# Patient Record
Sex: Female | Born: 1970 | Race: Black or African American | Hispanic: No | Smoking: Never smoker
Health system: Southern US, Community
[De-identification: ages and names within clinical notes are randomized; demographics above are authoritative.]

## PROBLEM LIST (undated history)

## (undated) DIAGNOSIS — I2699 Other pulmonary embolism without acute cor pulmonale: Secondary | ICD-10-CM

## (undated) DIAGNOSIS — G43909 Migraine, unspecified, not intractable, without status migrainosus: Secondary | ICD-10-CM

## (undated) DIAGNOSIS — R002 Palpitations: Secondary | ICD-10-CM

## (undated) DIAGNOSIS — D649 Anemia, unspecified: Secondary | ICD-10-CM

## (undated) DIAGNOSIS — R569 Unspecified convulsions: Secondary | ICD-10-CM

## (undated) DIAGNOSIS — J40 Bronchitis, not specified as acute or chronic: Secondary | ICD-10-CM

## (undated) DIAGNOSIS — F329 Major depressive disorder, single episode, unspecified: Secondary | ICD-10-CM

## (undated) DIAGNOSIS — J45909 Unspecified asthma, uncomplicated: Secondary | ICD-10-CM

## (undated) DIAGNOSIS — F32A Depression, unspecified: Secondary | ICD-10-CM

## (undated) DIAGNOSIS — N809 Endometriosis, unspecified: Secondary | ICD-10-CM

## (undated) HISTORY — PX: DILATION AND CURETTAGE OF UTERUS: SHX78

## (undated) HISTORY — DX: Unspecified convulsions: R56.9

## (undated) HISTORY — DX: Endometriosis, unspecified: N80.9

## (undated) HISTORY — DX: Bronchitis, not specified as acute or chronic: J40

## (undated) HISTORY — DX: Migraine, unspecified, not intractable, without status migrainosus: G43.909

## (undated) HISTORY — DX: Depression, unspecified: F32.A

## (undated) HISTORY — PX: APPENDECTOMY: SHX54

## (undated) HISTORY — DX: Major depressive disorder, single episode, unspecified: F32.9

---

## 2003-12-17 HISTORY — PX: ABDOMINAL HYSTERECTOMY: SHX81

## 2008-12-06 ENCOUNTER — Ambulatory Visit: Payer: Self-pay | Admitting: Family Medicine

## 2009-07-09 ENCOUNTER — Ambulatory Visit: Payer: Self-pay | Admitting: Internal Medicine

## 2010-02-11 ENCOUNTER — Ambulatory Visit: Payer: Self-pay | Admitting: Family Medicine

## 2011-03-31 ENCOUNTER — Ambulatory Visit: Payer: Self-pay | Admitting: Family Medicine

## 2013-02-04 ENCOUNTER — Ambulatory Visit: Payer: Self-pay | Admitting: Emergency Medicine

## 2013-02-09 ENCOUNTER — Ambulatory Visit: Payer: Self-pay | Admitting: Family Medicine

## 2013-02-12 ENCOUNTER — Emergency Department: Payer: Self-pay | Admitting: Emergency Medicine

## 2013-02-12 LAB — ETHANOL: Ethanol %: <0.003 % (ref 0.000–0.080)

## 2013-02-12 LAB — COMPREHENSIVE METABOLIC PANEL
Albumin: 3.1 g/dL — ABNORMAL LOW (ref 3.4–5.0)
Alkaline Phosphatase: 44 U/L — ABNORMAL LOW (ref 50–136)
Anion Gap: 7 (ref 7–16)
BUN: 21 mg/dL — ABNORMAL HIGH (ref 7–18)
Bilirubin,Total: 0.5 mg/dL (ref 0.2–1.0)
Calcium, Total: 7.8 mg/dL — ABNORMAL LOW (ref 8.5–10.1)
Chloride: 104 mmol/L (ref 98–107)
Co2: 27 mmol/L (ref 21–32)
Creatinine: 0.96 mg/dL (ref 0.60–1.30)
EGFR (African American): >60
EGFR (Non-African Amer.): >60
Glucose: 141 mg/dL — ABNORMAL HIGH (ref 65–99)
Total Protein: 6.5 g/dL (ref 6.4–8.2)

## 2013-02-12 LAB — CBC
HCT: 32.1 % — ABNORMAL LOW (ref 35.0–47.0)
HGB: 10.7 g/dL — ABNORMAL LOW (ref 12.0–16.0)
MCH: 30 pg (ref 26.0–34.0)
MCHC: 33.5 g/dL (ref 32.0–36.0)
MCV: 90 fL (ref 80–100)
Platelet: 256 10*3/uL (ref 150–440)
RBC: 3.58 10*6/uL — ABNORMAL LOW (ref 3.80–5.20)
RDW: 12.7 % (ref 11.5–14.5)

## 2013-03-05 ENCOUNTER — Ambulatory Visit: Payer: Self-pay | Admitting: Internal Medicine

## 2013-06-21 ENCOUNTER — Ambulatory Visit (INDEPENDENT_AMBULATORY_CARE_PROVIDER_SITE_OTHER): Payer: BC Managed Care – PPO | Admitting: Internal Medicine

## 2013-06-21 ENCOUNTER — Encounter: Payer: Self-pay | Admitting: Internal Medicine

## 2013-06-21 VITALS — BP 110/70 | HR 75 | Temp 98.4°F | Ht 67.0 in | Wt 188.0 lb

## 2013-06-21 DIAGNOSIS — M8580 Other specified disorders of bone density and structure, unspecified site: Secondary | ICD-10-CM | POA: Insufficient documentation

## 2013-06-21 DIAGNOSIS — Z1239 Encounter for other screening for malignant neoplasm of breast: Secondary | ICD-10-CM | POA: Insufficient documentation

## 2013-06-21 DIAGNOSIS — M899 Disorder of bone, unspecified: Secondary | ICD-10-CM

## 2013-06-21 DIAGNOSIS — Z0000 Encounter for general adult medical examination without abnormal findings: Secondary | ICD-10-CM

## 2013-06-21 LAB — COMPREHENSIVE METABOLIC PANEL
Albumin: 4 g/dL (ref 3.5–5.2)
CO2: 29 mEq/L (ref 19–32)
Calcium: 9.2 mg/dL (ref 8.4–10.5)
Chloride: 105 mEq/L (ref 96–112)
GFR: 86.62 mL/min (ref >60.00)
Glucose, Bld: 93 mg/dL (ref 70–99)
Potassium: 3.9 mEq/L (ref 3.5–5.1)
Sodium: 139 mEq/L (ref 135–145)
Total Protein: 7.4 g/dL (ref 6.0–8.3)

## 2013-06-21 LAB — CBC WITH DIFFERENTIAL/PLATELET
Eosinophils Relative: 2.8 % (ref 0.0–5.0)
MCV: 90.1 fl (ref 78.0–100.0)
Monocytes Absolute: 0.3 10*3/uL (ref 0.1–1.0)
Monocytes Relative: 7.5 % (ref 3.0–12.0)
Neutrophils Relative %: 60.8 % (ref 43.0–77.0)
Platelets: 225 10*3/uL (ref 150.0–400.0)
WBC: 3.5 10*3/uL — ABNORMAL LOW (ref 4.5–10.5)

## 2013-06-21 LAB — LIPID PANEL
Cholesterol: 228 mg/dL — ABNORMAL HIGH (ref 0–200)
Total CHOL/HDL Ratio: 3
Triglycerides: 53 mg/dL (ref 0.0–149.0)

## 2013-06-21 NOTE — Assessment & Plan Note (Signed)
Mammogram ordered

## 2013-06-21 NOTE — Progress Notes (Signed)
  Subjective:    Patient ID: Patricia Collins, female    DOB: 1971-03-11, 42 y.o.   MRN: [id number]  HPI 42YO female with h/o endometriosis s/p hysterectomy and osteopenia presents to establish care. Doing well. Only concern today is need for follow up on osteopenia. Notes h/o osteopenia and Vitamin D deficiency in the past. Otherwise, feeling well.  Distant h/o seizure disorder, however none in last >20 years. Previously treated with phenobarbital.  No outpatient encounter prescriptions on file as of 06/21/2013.   No facility-administered encounter medications on file as of 06/21/2013.   BP 110/70  Pulse 75  Temp(Src) 98.4 F (36.9 C) (Oral)  Ht 5\' 7"  (1.702 m)  Wt 188 lb (85.276 kg)  BMI 29.44 kg/m2  SpO2 99%  Review of Systems  Constitutional: Negative for fever, chills, appetite change, fatigue and unexpected weight change.  HENT: Negative for ear pain, congestion, sore throat, trouble swallowing, neck pain, voice change and sinus pressure.   Eyes: Negative for visual disturbance.  Respiratory: Negative for cough, shortness of breath, wheezing and stridor.   Cardiovascular: Negative for chest pain, palpitations and leg swelling.  Gastrointestinal: Negative for nausea, vomiting, abdominal pain, diarrhea, constipation, blood in stool, abdominal distention and anal bleeding.  Genitourinary: Negative for dysuria and flank pain.  Musculoskeletal: Negative for myalgias, arthralgias and gait problem.  Skin: Negative for color change and rash.  Neurological: Negative for dizziness and headaches.  Hematological: Negative for adenopathy. Does not bruise/bleed easily.  Psychiatric/Behavioral: Negative for suicidal ideas, sleep disturbance and dysphoric mood. The patient is not nervous/anxious.        Objective:   Physical Exam  Constitutional: She is oriented to person, place, and time. She appears well-developed and well-nourished. No distress.  HENT:  Head: Normocephalic and atraumatic.   Right Ear: External ear normal.  Left Ear: External ear normal.  Nose: Nose normal.  Mouth/Throat: Oropharynx is clear and moist. No oropharyngeal exudate.  Eyes: Conjunctivae are normal. Pupils are equal, round, and reactive to light. Right eye exhibits no discharge. Left eye exhibits no discharge. No scleral icterus.  Neck: Normal range of motion. Neck supple. No tracheal deviation present. No thyromegaly present.  Cardiovascular: Normal rate, regular rhythm, normal heart sounds and intact distal pulses.  Exam reveals no gallop and no friction rub.   No murmur heard. Pulmonary/Chest: Effort normal and breath sounds normal. No accessory muscle usage. Not tachypneic. No respiratory distress. She has no decreased breath sounds. She has no wheezes. She has no rhonchi. She has no rales. She exhibits no tenderness.  Abdominal: Soft. Bowel sounds are normal. She exhibits no distension and no mass. There is no tenderness. There is no rebound and no guarding.  Musculoskeletal: Normal range of motion. She exhibits no edema and no tenderness.  Lymphadenopathy:    She has no cervical adenopathy.  Neurological: She is alert and oriented to person, place, and time. No cranial nerve deficit. She exhibits normal muscle tone. Coordination normal.  Skin: Skin is warm and dry. No rash noted. She is not diaphoretic. No erythema. No pallor.  Psychiatric: She has a normal mood and affect. Her behavior is normal. Judgment and thought content normal.          Assessment & Plan:

## 2013-06-21 NOTE — Assessment & Plan Note (Signed)
Pt reports h/o osteopenia. Will recheck bone density testing and check Vit D with labs. Will request previous records.

## 2013-06-22 ENCOUNTER — Ambulatory Visit: Payer: BC Managed Care – PPO

## 2013-06-22 DIAGNOSIS — Z0000 Encounter for general adult medical examination without abnormal findings: Secondary | ICD-10-CM

## 2013-06-22 DIAGNOSIS — M899 Disorder of bone, unspecified: Secondary | ICD-10-CM

## 2013-06-22 LAB — VITAMIN B12: Vitamin B-12: 391 pg/mL (ref 211–911)

## 2013-06-23 ENCOUNTER — Encounter: Payer: Self-pay | Admitting: *Deleted

## 2013-06-25 ENCOUNTER — Telehealth: Payer: Self-pay | Admitting: Internal Medicine

## 2013-06-25 DIAGNOSIS — R41840 Attention and concentration deficit: Secondary | ICD-10-CM

## 2013-06-25 NOTE — Telephone Encounter (Signed)
Patient aware of this.

## 2013-06-25 NOTE — Telephone Encounter (Signed)
We can place referral to psychology for ADD evaluation. I will place order.

## 2013-06-25 NOTE — Telephone Encounter (Signed)
Patient called stating that she is having issues concentrating and memory loss. She states that Dr. Dan Humphreys and herself talked about some testing. Please advise. She does not have a preference with MD's.

## 2013-07-04 ENCOUNTER — Emergency Department: Payer: Self-pay | Admitting: Emergency Medicine

## 2013-07-04 LAB — COMPREHENSIVE METABOLIC PANEL
BUN: 14 mg/dL (ref 7–18)
Co2: 30 mmol/L (ref 21–32)
EGFR (Non-African Amer.): >60
Potassium: 4.1 mmol/L (ref 3.5–5.1)
SGOT(AST): 28 U/L (ref 15–37)
Sodium: 140 mmol/L (ref 136–145)
Total Protein: 7.6 g/dL (ref 6.4–8.2)

## 2013-07-04 LAB — CBC WITH DIFFERENTIAL/PLATELET
Eosinophil #: 0.1 10*3/uL (ref 0.0–0.7)
HCT: 31.8 % — ABNORMAL LOW (ref 35.0–47.0)
HGB: 10.5 g/dL — ABNORMAL LOW (ref 12.0–16.0)
Lymphocyte #: 0.7 10*3/uL — ABNORMAL LOW (ref 1.0–3.6)
Lymphocyte %: 12.3 %
Neutrophil %: 79.9 %

## 2013-07-07 ENCOUNTER — Ambulatory Visit (INDEPENDENT_AMBULATORY_CARE_PROVIDER_SITE_OTHER): Payer: BC Managed Care – PPO | Admitting: Adult Health

## 2013-07-07 ENCOUNTER — Encounter: Payer: Self-pay | Admitting: Adult Health

## 2013-07-07 VITALS — BP 108/62 | HR 68 | Temp 98.6°F | Resp 12 | Wt 189.0 lb

## 2013-07-07 DIAGNOSIS — Z87898 Personal history of other specified conditions: Secondary | ICD-10-CM | POA: Insufficient documentation

## 2013-07-07 DIAGNOSIS — R1110 Vomiting, unspecified: Secondary | ICD-10-CM

## 2013-07-07 LAB — BASIC METABOLIC PANEL
BUN: 11 mg/dL (ref 6–23)
GFR: 92.38 mL/min (ref >60.00)
Potassium: 4.2 mEq/L (ref 3.5–5.1)
Sodium: 139 mEq/L (ref 135–145)

## 2013-07-07 LAB — CBC WITH DIFFERENTIAL/PLATELET
Eosinophils Relative: 4.3 % (ref 0.0–5.0)
HCT: 32 % — ABNORMAL LOW (ref 36.0–46.0)
Hemoglobin: 10.6 g/dL — ABNORMAL LOW (ref 12.0–15.0)
Lymphocytes Relative: 32 % (ref 12.0–46.0)
Lymphs Abs: 1 10*3/uL (ref 0.7–4.0)
Monocytes Relative: 8.1 % (ref 3.0–12.0)
Platelets: 203 10*3/uL (ref 150.0–400.0)
WBC: 3.1 10*3/uL — ABNORMAL LOW (ref 4.5–10.5)

## 2013-07-07 NOTE — Progress Notes (Signed)
  Subjective:    Patient ID: Patricia Collins, female    DOB: 09/19/1971, 42 y.o.   MRN: [id number]  HPI  Patient reports for hospital f/u. She went to the ED after vomiting extensively on Saturday. She reports that she began to feel faint. In the ED she was given fluid ruscusitation and medication for vomiting. She believes the vomiting was attributed to something she ate. She was in the ED for approximately 12 hours. Pt is feeling weak. Unfortunately I do not have any of the hospital records for this ED visit and the patient is not able to answer details such as if they told her that her white count was elevated or that she had infection. She does not know if her electrolytes were off and if she received any replacement of such. I will obtain medical records.   Review of Systems  Constitutional: Positive for appetite change and fatigue. Negative for fever and chills.  Respiratory: Negative.   Cardiovascular: Negative.   Gastrointestinal: Negative for nausea, vomiting, abdominal pain and diarrhea.  Genitourinary: Negative.   Neurological: Positive for weakness. Negative for dizziness, light-headedness and headaches.  Psychiatric/Behavioral: Negative.   All other systems reviewed and are negative.    BP 108/62  Pulse 68  Temp(Src) 98.6 F (37 C) (Oral)  Resp 12  Wt 189 lb (85.73 kg)  BMI 29.59 kg/m2  SpO2 98%    Objective:   Physical Exam  Constitutional: She is oriented to person, place, and time. She appears well-developed and well-nourished. No distress.  Cardiovascular: Normal rate, regular rhythm and normal heart sounds.  Exam reveals no gallop and no friction rub.   No murmur heard. Pulmonary/Chest: Effort normal. No respiratory distress. She has no wheezes. She has rales.  Abdominal: Soft.  Hyperactive bowel sounds.  Neurological: She is alert and oriented to person, place, and time. She has normal reflexes.  Skin: Skin is warm and dry.  Psychiatric: She has a normal mood  and affect. Her behavior is normal. Judgment and thought content normal.      Assessment & Plan:

## 2013-07-07 NOTE — Assessment & Plan Note (Signed)
Patient was recently seen in the ED on Sunday for vomiting 2/2 possible food poisoning. She was given fluid resuscitation and antiemetic. She is no longer vomiting but still feeling weak. Check metabolic panel and cbc. Patient to continue diet as tolerated.

## 2013-07-07 NOTE — Patient Instructions (Signed)
  Please have labs drawn prior to leaving the office.  We will notify you of results once they are available.  Diet as tolerated.  Drink fluids to maintain hydration.  Call if symptoms return.

## 2013-07-08 ENCOUNTER — Emergency Department: Payer: Self-pay | Admitting: Emergency Medicine

## 2013-07-19 ENCOUNTER — Ambulatory Visit: Payer: BC Managed Care – PPO | Admitting: Internal Medicine

## 2013-07-19 ENCOUNTER — Ambulatory Visit (INDEPENDENT_AMBULATORY_CARE_PROVIDER_SITE_OTHER): Payer: BC Managed Care – PPO | Admitting: Internal Medicine

## 2013-07-19 ENCOUNTER — Encounter: Payer: Self-pay | Admitting: Internal Medicine

## 2013-07-19 VITALS — BP 120/86 | HR 78 | Temp 98.6°F | Wt 187.0 lb

## 2013-07-19 DIAGNOSIS — S61219A Laceration without foreign body of unspecified finger without damage to nail, initial encounter: Secondary | ICD-10-CM | POA: Insufficient documentation

## 2013-07-19 DIAGNOSIS — S61219D Laceration without foreign body of unspecified finger without damage to nail, subsequent encounter: Secondary | ICD-10-CM

## 2013-07-19 DIAGNOSIS — Z5189 Encounter for other specified aftercare: Secondary | ICD-10-CM

## 2013-07-19 DIAGNOSIS — D649 Anemia, unspecified: Secondary | ICD-10-CM

## 2013-07-19 NOTE — Progress Notes (Signed)
Subjective:    Patient ID: Patricia Collins, female    DOB: 1971/02/21, 42 y.o.   MRN: [id number]  HPI 42 year old female presents for followup. She reports that approximately one week ago she was using a knife in her kitchen when she accidentally sliced her right index finger. She was seen in the ER and had 3 stitches placed. She reports that the area has been tender to palpation. She denies any drainage from the wound. She denies any fever or chills. She reports that she is up-to-date on her tetanus shot.  In regards to recent lab work which showed mild anemia, she reports that throughout her entire life she has been told she is anemic. This was never evaluated further. She denies any fatigue, shortness of breath, chest pain, palpitations. She is status post hysterectomy for endometriosis several years ago. She denies any change in bowel habits, blood in her stool or black stool.  No outpatient encounter prescriptions on file as of 07/19/2013.   No facility-administered encounter medications on file as of 07/19/2013.   BP 120/86  Pulse 78  Temp(Src) 98.6 F (37 C) (Oral)  Wt 187 lb (84.823 kg)  BMI 29.28 kg/m2  SpO2 96%  Review of Systems  Constitutional: Negative for fever, chills, appetite change, fatigue and unexpected weight change.  HENT: Negative for ear pain, congestion, sore throat, trouble swallowing, neck pain, voice change and sinus pressure.   Eyes: Negative for visual disturbance.  Respiratory: Negative for cough, shortness of breath, wheezing and stridor.   Cardiovascular: Negative for chest pain, palpitations and leg swelling.  Gastrointestinal: Negative for nausea, vomiting, abdominal pain, diarrhea, constipation, blood in stool, abdominal distention and anal bleeding.  Genitourinary: Negative for dysuria and flank pain.  Musculoskeletal: Negative for myalgias, arthralgias and gait problem.  Skin: Negative for color change and rash.  Neurological: Negative for dizziness and  headaches.  Hematological: Negative for adenopathy. Does not bruise/bleed easily.  Psychiatric/Behavioral: Negative for suicidal ideas, sleep disturbance and dysphoric mood. The patient is not nervous/anxious.        Objective:   Physical Exam  Constitutional: She is oriented to person, place, and time. She appears well-developed and well-nourished. No distress.  HENT:  Head: Normocephalic and atraumatic.  Right Ear: External ear normal.  Left Ear: External ear normal.  Nose: Nose normal.  Mouth/Throat: Oropharynx is clear and moist. No oropharyngeal exudate.  Eyes: Conjunctivae are normal. Pupils are equal, round, and reactive to light. Right eye exhibits no discharge. Left eye exhibits no discharge. No scleral icterus.  Neck: Normal range of motion. Neck supple. No tracheal deviation present. No thyromegaly present.  Cardiovascular: Normal rate, regular rhythm, normal heart sounds and intact distal pulses.  Exam reveals no gallop and no friction rub.   No murmur heard. Pulmonary/Chest: Effort normal and breath sounds normal. No accessory muscle usage. Not tachypneic. No respiratory distress. She has no decreased breath sounds. She has no wheezes. She has no rhonchi. She has no rales. She exhibits no tenderness.  Musculoskeletal: Normal range of motion. She exhibits no edema and no tenderness.  Lymphadenopathy:    She has no cervical adenopathy.  Neurological: She is alert and oriented to person, place, and time. No cranial nerve deficit. She exhibits normal muscle tone. Coordination normal.  Skin: Skin is warm and dry. Laceration (right index finger, with 3 sutures, no surrounding erythema or induration) noted. No rash noted. She is not diaphoretic. No erythema. No pallor.  Psychiatric: She has a normal mood and affect.  Her behavior is normal. Judgment and thought content normal.          Assessment & Plan:

## 2013-07-19 NOTE — Assessment & Plan Note (Signed)
Laceration healing well. Sutures removed. Will request status patient's recent tetanus shot.

## 2013-07-19 NOTE — Assessment & Plan Note (Signed)
Blood counts show mild anemia. Patient reports long history of mild anemia he denies a child. Ferritin was slightly low at 21. No evidence of blood loss, however will check stool hemoccult. Will also set up hematology evaluation given her h/o chronic anemia, question if she may have underlying thalassemia or other inherited reason for mild anemia.

## 2013-10-04 ENCOUNTER — Ambulatory Visit (INDEPENDENT_AMBULATORY_CARE_PROVIDER_SITE_OTHER)
Admission: RE | Admit: 2013-10-04 | Discharge: 2013-10-04 | Disposition: A | Discharge status: Home / Self Care | Payer: BC Managed Care – PPO | Source: Ambulatory Visit | Attending: Internal Medicine | Admitting: Internal Medicine

## 2013-10-04 ENCOUNTER — Encounter: Payer: Self-pay | Admitting: Internal Medicine

## 2013-10-04 ENCOUNTER — Encounter (INDEPENDENT_AMBULATORY_CARE_PROVIDER_SITE_OTHER): Payer: Self-pay

## 2013-10-04 ENCOUNTER — Ambulatory Visit (INDEPENDENT_AMBULATORY_CARE_PROVIDER_SITE_OTHER): Payer: BC Managed Care – PPO | Admitting: Internal Medicine

## 2013-10-04 VITALS — BP 102/80 | HR 72 | Temp 98.5°F | Wt 189.0 lb

## 2013-10-04 DIAGNOSIS — R05 Cough: Secondary | ICD-10-CM

## 2013-10-04 DIAGNOSIS — R059 Cough, unspecified: Secondary | ICD-10-CM

## 2013-10-04 DIAGNOSIS — D649 Anemia, unspecified: Secondary | ICD-10-CM

## 2013-10-04 DIAGNOSIS — J45909 Unspecified asthma, uncomplicated: Secondary | ICD-10-CM | POA: Insufficient documentation

## 2013-10-04 DIAGNOSIS — R053 Chronic cough: Secondary | ICD-10-CM

## 2013-10-04 MED ORDER — BENZONATATE 200 MG PO CAPS: 200.0000 mg | ORAL_CAPSULE | Freq: Two times a day (BID) | ORAL | Status: DC | PRN

## 2013-10-04 NOTE — Progress Notes (Signed)
  Subjective:    Patient ID: Patricia Collins, female    DOB: [birth year], 42 y.o.   MRN: [id number]  HPI 42 year old female presents for acute visit complaining of one-week history of cough. She reports that ever since moving into a new apartment in September 2014 she has had intermittent episodes of cough. Last week, she developed cough productive of blood-tinged sputum. She denies any other upper respiratory symptoms such as nasal congestion, sneezing, shortness of breath, or fever. She does occasionally have some substernal chest pain with coughing. She is not a smoker. She is not around cigarette smoke. She has no history of asthma. She is not currently taking any medication for cough.  Outpatient Encounter Prescriptions as of 10/04/2013  Medication Sig Dispense Refill   No facility-administered encounter medications on file as of 10/04/2013.   BP 102/80  Pulse 72  Temp(Src) 98.5 F (36.9 C) (Oral)  Wt 189 lb (85.73 kg)  BMI 29.59 kg/m2  Review of Systems  Constitutional: Negative for fever, chills, appetite change, fatigue and unexpected weight change.  HENT: Negative for congestion, ear pain, postnasal drip, sinus pressure, sore throat, trouble swallowing and voice change.   Eyes: Negative for visual disturbance.  Respiratory: Positive for cough. Negative for shortness of breath, wheezing and stridor.   Cardiovascular: Negative for palpitations and leg swelling.  Gastrointestinal: Negative for abdominal pain.  Genitourinary: Negative for dysuria and flank pain.  Musculoskeletal: Negative for arthralgias, gait problem, myalgias and neck pain.  Skin: Negative for color change and rash.  Neurological: Negative for dizziness and headaches.  Hematological: Negative for adenopathy. Does not bruise/bleed easily.       Objective:   Physical Exam  Constitutional: She is oriented to person, place, and time. She appears well-developed and well-nourished. No distress.  HENT:  Head:  Normocephalic and atraumatic.  Right Ear: External ear normal.  Left Ear: External ear normal.  Nose: Nose normal.  Mouth/Throat: Oropharynx is clear and moist. No oropharyngeal exudate.  Eyes: Conjunctivae are normal. Pupils are equal, round, and reactive to light. Right eye exhibits no discharge. Left eye exhibits no discharge. No scleral icterus.  Neck: Normal range of motion. Neck supple. No tracheal deviation present. No thyromegaly present.  Cardiovascular: Normal rate, regular rhythm, normal heart sounds and intact distal pulses.  Exam reveals no gallop and no friction rub.   No murmur heard. Pulmonary/Chest: Effort normal and breath sounds normal. No accessory muscle usage. Not tachypneic. No respiratory distress. She has no decreased breath sounds. She has no wheezes. She has no rhonchi. She has no rales. She exhibits no tenderness.  Musculoskeletal: Normal range of motion. She exhibits no edema and no tenderness.  Lymphadenopathy:    She has no cervical adenopathy.  Neurological: She is alert and oriented to person, place, and time. No cranial nerve deficit. She exhibits normal muscle tone. Coordination normal.  Skin: Skin is warm and dry. No rash noted. She is not diaphoretic. No erythema. No pallor.  Psychiatric: She has a normal mood and affect. Her behavior is normal. Judgment and thought content normal.          Assessment & Plan:

## 2013-10-04 NOTE — Assessment & Plan Note (Signed)
Hematology evaluation pending at [facility] for 10/2013.

## 2013-10-04 NOTE — Assessment & Plan Note (Signed)
2 month history of intermittent episodes of cough, now productive cough with occasional blood-tinged sputum. Exam is normal today, with pt breathing comfortably and no abnormalities noted on exam. (Note nurse unable to get pulse oximetry because of pt dark nail polish.) CXR today normal. Given persistent symptoms, question if she may have underlying asthma which was triggered by allergen in home environment. Will set up pulmonary evaluation for PFTs. Pt will call if any worsening symptoms over next few days. Will start Tessalon to help suppress cough.

## 2013-10-12 ENCOUNTER — Ambulatory Visit (INDEPENDENT_AMBULATORY_CARE_PROVIDER_SITE_OTHER): Payer: BC Managed Care – PPO | Admitting: Pulmonary Disease

## 2013-10-12 ENCOUNTER — Encounter: Payer: Self-pay | Admitting: Pulmonary Disease

## 2013-10-12 VITALS — BP 102/60 | HR 67 | Temp 97.9°F | Ht 67.0 in | Wt 190.8 lb

## 2013-10-12 DIAGNOSIS — R05 Cough: Secondary | ICD-10-CM | POA: Diagnosis not present

## 2013-10-12 DIAGNOSIS — K219 Gastro-esophageal reflux disease without esophagitis: Secondary | ICD-10-CM | POA: Diagnosis not present

## 2013-10-12 DIAGNOSIS — R053 Chronic cough: Secondary | ICD-10-CM

## 2013-10-12 DIAGNOSIS — R059 Cough, unspecified: Secondary | ICD-10-CM

## 2013-10-12 NOTE — Patient Instructions (Addendum)
Take prilosec 20mg  daily Follow the GERD lifestyle sheet  Use saline for post nasal drip (I like Neil Med rinse) and sinus congestion Use chlorpheniramine and phenylephrine combination pills for sinus congestoin  Use the steroid inhaler one puff twice a day for the next month  Rest your voice and suppress your cough as much as possible for 2-3 days, use Delsym to help with this.  Follow up with [place] in one month

## 2013-10-12 NOTE — Progress Notes (Signed)
Subjective:    Patient ID: Patricia Collins, female    DOB: [birth year], 42 y.o.   MRN: [id number]  HPI  Patricia Collins is a 42 year old female who comes to our clinic today for evaluation of cough. She had a normal childhood without respiratory illnesses but starting in September of 2013 she has had an intermittent cough which has been persistent. She initially was diagnosed with bronchitis over one year ago and was treated with multiple antibiotics. She was also treated with steroids and an inhaler. The cough had a barking quality and would occur sometimes in the office. There is no clear trigger or location which would set off. However she does note that the cough has been markedly worse when lying flat. She typically produces some phlegm but it has been dry lately. She had worsening of the symptoms about a month ago again with a harsh, "barking" cough. She does note that after one particularly bad coughing episode about 2 weeks ago she had a small amount of blood in her mucus. This is not recurred since then. She has not had shortness of breath with this. She occasionally has headaches at the lung with that. She has never had any sort of breathing test in the past. She has never smoked cigarettes.  She has noted that she has had bad acid reflux for the last several years. She's currently not taking anything for this. She does not follow a GERD specific diet.  She notes that she has had some allergic rhinitis issues over the years but this has not been particularly worse in the last several weeks.   Past Medical History  Diagnosis Date  . Depression   . Endometriosis   . Migraine     improved with diet  . Bronchitis   . Seizures     42YO, phenobarbital in the past     Family History  Problem Relation Age of Onset  . Stroke Mother   . Heart disease Mother     h/o rheumatic fever  . Diabetes Mother   . Hypertension Mother   . Stroke Father   . Arthritis Father   . Hypertension Brother    . Diabetes Brother      History   Social History  . Marital Status: Single    Spouse Name: N/A    Number of Children: N/A  . Years of Education: N/A   Occupational History  . Human Resources Benefits Specialist    Social History Main Topics  . Smoking status: Never Smoker   . Smokeless tobacco: Never Used  . Alcohol Use: No  . Drug Use: No  . Sexual Activity: Not on file   Other Topics Concern  . Not on file   Social History Narrative   Lives in [place]. From [place]. Has 1 daughter.      Work - HR      Diet - healthy, limited sugars     Allergies  Allergen Reactions  . Aspirin Shortness Of Breath  . Penicillins Shortness Of Breath  . Latex      Outpatient Prescriptions Prior to Visit  Medication Sig Dispense Refill  . benzonatate (TESSALON) 200 MG capsule Take 1 capsule (200 mg total) by mouth 2 (two) times daily as needed for cough.  20 capsule  0   No facility-administered medications prior to visit.       Review of Systems  Constitutional: Negative for fever, chills and unexpected weight change.  HENT: Positive for congestion and sore  throat. Negative for dental problem, ear pain, nosebleeds, postnasal drip, rhinorrhea, sinus pressure, sneezing, trouble swallowing and voice change.   Eyes: Negative for visual disturbance.  Respiratory: Positive for cough and shortness of breath. Negative for choking.   Cardiovascular: Negative for chest pain and leg swelling.  Gastrointestinal: Positive for abdominal pain. Negative for vomiting and diarrhea.  Genitourinary: Negative for difficulty urinating.       Heartburn Indigestion  Musculoskeletal: Negative for arthralgias.  Skin: Negative for rash.  Neurological: Positive for headaches. Negative for tremors and syncope.  Hematological: Does not bruise/bleed easily.       Objective:   Physical Exam  Filed Vitals:   10/12/13 1405  BP: 102/60  Pulse: 67  Temp: 97.9 F (36.6 C)  TempSrc: Oral   Height: 5\' 7"  (1.702 m)  Weight: 190 lb 12.8 oz (86.546 kg)  SpO2: 98%  RA  Gen: well appearing, no acute distress; did not cough once during interview or exam HEENT: NCAT, PERRL, EOMi, OP clear, neck supple without masses PULM: CTA B CV: RRR, no mgr, no JVD AB: BS+, soft, nontender, no hsm Ext: warm, no edema, no clubbing, no cyanosis Derm: no rash or skin breakdown Neuro: A&Ox4, CN II-XII intact, strength 5/5 in all 4 extremities     Assessment & Plan:   Persistent cough Patricia Collins was unable to successfully complete simple spirometry today. However, I do not think that she has asthma. Considering her persistent cough though we do need to get pulmonary function testing to help insure that there is no underlying pulmonary disease.  She has a prior history of eczema and allergic rhinitis so it would be reasonable to get PFTs to look for airway obstruction (i.e. asthma).   I am encouraged by the fact that her recent chest x-ray and exam today was normal.  I think the most likely etiology of her cough is acid reflux, followed by some postnasal drip as well has some degree of cyclical cough from ongoing coughing. We really need to maximize therapy for her acid reflux and encourage her to stop coughing and use cough suppressive therapy.  Plan: -Full pulmonary function testing at [facility] -Use omeprazole regularly -GERD lifestyle modification encouraged -Trial of steroid inhaler -Chlorpheniramine -Phenylephrine -Voice rest -Followup in 2-4 weeks  Acid reflux Prilosec daily GERD lifestyle changes   Updated Medication List Outpatient Encounter Prescriptions as of 10/12/2013  Medication Sig Dispense Refill  . [DISCONTINUED] benzonatate (TESSALON) 200 MG capsule Take 1 capsule (200 mg total) by mouth 2 (two) times daily as needed for cough.  20 capsule  0   No facility-administered encounter medications on file as of 10/12/2013.

## 2013-10-13 DIAGNOSIS — K219 Gastro-esophageal reflux disease without esophagitis: Secondary | ICD-10-CM | POA: Insufficient documentation

## 2013-10-13 NOTE — Assessment & Plan Note (Signed)
Prilosec daily GERD lifestyle changes

## 2013-10-13 NOTE — Assessment & Plan Note (Addendum)
Patricia Collins was unable to successfully complete simple spirometry today. However, I do not think that she has asthma. Considering her persistent cough though we do need to get pulmonary function testing to help insure that there is no underlying pulmonary disease.  She has a prior history of eczema and allergic rhinitis so it would be reasonable to get PFTs to look for airway obstruction (i.e. asthma).   I am encouraged by the fact that her recent chest x-ray and exam today was normal.  I think the most likely etiology of her cough is acid reflux, followed by some postnasal drip as well has some degree of cyclical cough from ongoing coughing. We really need to maximize therapy for her acid reflux and encourage her to stop coughing and use cough suppressive therapy.  Plan: -Full pulmonary function testing at [facility] -Use omeprazole regularly -GERD lifestyle modification encouraged -Trial of steroid inhaler -Chlorpheniramine -Phenylephrine -Voice rest -Followup in 2-4 weeks

## 2013-10-21 ENCOUNTER — Ambulatory Visit: Payer: Self-pay | Admitting: Pulmonary Disease

## 2013-10-21 LAB — PULMONARY FUNCTION TEST

## 2013-10-28 ENCOUNTER — Telehealth: Payer: Self-pay

## 2013-10-28 ENCOUNTER — Encounter: Payer: Self-pay | Admitting: Pulmonary Disease

## 2013-10-28 NOTE — Telephone Encounter (Signed)
Message copied by Velvet Bathe on Thu Oct 28, 2013 12:08 PM ------      Message from: Max Fickle B      Created: Thu Oct 28, 2013  6:44 AM       A,            Please let her know that her PFT's from [facility] looked like asthma.  She should continue using the inhaler we prescribed.            Thanks      B ------

## 2013-10-28 NOTE — Telephone Encounter (Signed)
Spoke with pt, she is aware of PFT results. Patricia Collins L

## 2013-11-16 ENCOUNTER — Ambulatory Visit: Payer: BC Managed Care – PPO | Admitting: Pulmonary Disease

## 2013-11-16 NOTE — Progress Notes (Signed)
No show

## 2013-11-18 ENCOUNTER — Telehealth: Payer: Self-pay | Admitting: Pulmonary Disease

## 2013-11-18 ENCOUNTER — Encounter: Payer: Self-pay | Admitting: Pulmonary Disease

## 2013-11-18 NOTE — Telephone Encounter (Signed)
Pt requesting refill replacement of inhaler she left in [place] over the [place]. Does not know name of inhaler given--nothing documented in chart d/t sample being given. Pt states that she never completed using inhaler d/t it making her drowsy. Advised to wait until appt tomorrow 12/5 with BQ on decision to either restart inhaler or if he is going to change to something else. Pt will discuss with BQ at visit.   Nothing further needed.

## 2013-11-19 ENCOUNTER — Ambulatory Visit: Payer: BC Managed Care – PPO | Admitting: Pulmonary Disease

## 2013-12-03 ENCOUNTER — Encounter: Payer: Self-pay | Admitting: Pulmonary Disease

## 2013-12-03 ENCOUNTER — Ambulatory Visit (INDEPENDENT_AMBULATORY_CARE_PROVIDER_SITE_OTHER): Payer: BC Managed Care – PPO | Admitting: Pulmonary Disease

## 2013-12-03 VITALS — BP 106/68 | HR 76 | Temp 97.8°F | Ht 67.0 in | Wt 195.0 lb

## 2013-12-03 DIAGNOSIS — R059 Cough, unspecified: Secondary | ICD-10-CM

## 2013-12-03 DIAGNOSIS — R053 Chronic cough: Secondary | ICD-10-CM

## 2013-12-03 DIAGNOSIS — R05 Cough: Secondary | ICD-10-CM

## 2013-12-03 MED ORDER — AEROCHAMBER MV MISC: Status: AC

## 2013-12-03 NOTE — Patient Instructions (Signed)
Stop the Aerospan Use QVar 2 puffs at night with a spacer If your symptoms don't improve then take 2 puffs twice a day Use mucinex as needed for thick mucus Call us when the sample has been used up and we will send in an Rx We will see you back in 4-6 months or sooner if needed

## 2013-12-03 NOTE — Progress Notes (Signed)
   Subjective:    Patient ID: Patricia Collins, female    DOB: November 25, 1971, 42 y.o.   MRN: [id number]  Synopsis: Doneisha Ivey First saw the [facility] pulmonary clinic in 09/2013 with cough. She responded well to voice rest and had PFTs suggestive of asthma: 10/2013 Full PFT [facility] > Ratio 69% FEV1 1.98L (66% pred) changed to 2.29 L with bronchodilator (16% change); TLC 3.78L (67% pred), RV 0.92L (48% pred), ERV 0.76L (59% pred) DLCO 12.7 (48% pred)   HPI  12/03/2013 ROV > [place] says that her couth is better since the last visit.  Voice rest has helped.  She has been producing phlegm which is dark yellow in color.  Only dyspnea with exertion.  The Kellie Moor would actually make her feel more sleepy., but it did help her breathing.  It helped with the cough and the mucus production.  Past Medical History  Diagnosis Date  . Depression   . Endometriosis   . Migraine     improved with diet  . Bronchitis   . Seizures     42YO, phenobarbital in the past     Review of Systems     Objective:   Physical Exam Filed Vitals:   12/03/13 1655  BP: 106/68  Pulse: 76  Temp: 97.8 F (36.6 C)  TempSrc: Oral  Height: 5\' 7"  (1.702 m)  Weight: 195 lb (88.451 kg)  SpO2: 97%   RA  Gen; well appearing HEENT: NCAT, EOMi PULM: CTA B CV :RRR  No mgr AB: BS+, soft, nontender Ext: warm, no edema       Assessment & Plan:   Asthma Her cough is better with voice rest and treatment for Asthma.  Her PFTs were suggestive of asthma.  Based on her symptoms, she has moderate persistent asthma.  Plan: -She didn't tolerate the aerospan so we will try QVar   Updated Medication List Outpatient Encounter Prescriptions as of 12/03/2013  Medication Sig  . Flunisolide HFA (AEROSPAN) 80 MCG/ACT AERS Inhale 1 puff into the lungs 2 (two) times daily.  Marland Kitchen Spacer/Aero-Holding Chambers (AEROCHAMBER MV) inhaler Use as instructed

## 2013-12-03 NOTE — Assessment & Plan Note (Signed)
Her cough is better with voice rest and treatment for Asthma.  Her PFTs were suggestive of asthma.  Based on her symptoms, she has moderate persistent asthma.  Plan: -She didn't tolerate the aerospan so we will try QVar

## 2013-12-21 ENCOUNTER — Ambulatory Visit: Payer: Self-pay | Admitting: Internal Medicine

## 2013-12-29 ENCOUNTER — Ambulatory Visit: Payer: Self-pay | Admitting: Internal Medicine

## 2013-12-31 ENCOUNTER — Telehealth: Payer: Self-pay | Admitting: Internal Medicine

## 2013-12-31 NOTE — Telephone Encounter (Signed)
Bone density testing was normal with T-score -0.9. 

## 2013-12-31 NOTE — Telephone Encounter (Signed)
Letter mailed to patient home address on file  

## 2014-01-17 ENCOUNTER — Encounter: Payer: Self-pay | Admitting: Internal Medicine

## 2014-01-21 ENCOUNTER — Encounter: Payer: Self-pay | Admitting: Internal Medicine

## 2014-05-30 ENCOUNTER — Telehealth: Payer: Self-pay | Admitting: *Deleted

## 2014-05-30 NOTE — Telephone Encounter (Signed)
No. It was ordered under the code osteopenia. May have to confirm with Myriam JacobsonHelen or Eber JonesCarolyn about how order may have been changed.

## 2014-05-30 NOTE — Telephone Encounter (Signed)
I asked Myriam JacobsonHelen about this but she was unable to give me an answer.  Please advise as to what needs to be done.

## 2014-05-30 NOTE — Telephone Encounter (Signed)
Pt states that her bone density order was placed using a dx code for asthma, this needs to be changed

## 2014-08-17 ENCOUNTER — Ambulatory Visit (INDEPENDENT_AMBULATORY_CARE_PROVIDER_SITE_OTHER): Payer: BC Managed Care – PPO | Admitting: Adult Health

## 2014-08-17 ENCOUNTER — Ambulatory Visit: Payer: Self-pay | Admitting: Adult Health

## 2014-08-17 ENCOUNTER — Telehealth: Payer: Self-pay

## 2014-08-17 ENCOUNTER — Other Ambulatory Visit: Payer: Self-pay | Admitting: Adult Health

## 2014-08-17 ENCOUNTER — Encounter: Payer: Self-pay | Admitting: Adult Health

## 2014-08-17 VITALS — BP 107/72 | HR 76 | Temp 98.3°F | Resp 14 | Wt 196.5 lb

## 2014-08-17 DIAGNOSIS — G514 Facial myokymia: Secondary | ICD-10-CM | POA: Insufficient documentation

## 2014-08-17 DIAGNOSIS — G518 Other disorders of facial nerve: Secondary | ICD-10-CM

## 2014-08-17 LAB — CBC WITH DIFFERENTIAL/PLATELET
Basophils Absolute: 0 10*3/uL (ref 0.0–0.1)
Basophils Relative: 0.7 % (ref 0.0–3.0)
EOS ABS: 0.1 10*3/uL (ref 0.0–0.7)
Eosinophils Relative: 4.6 % (ref 0.0–5.0)
HCT: 32.2 % — ABNORMAL LOW (ref 36.0–46.0)
HEMOGLOBIN: 10.7 g/dL — AB (ref 12.0–15.0)
LYMPHS PCT: 28.8 % (ref 12.0–46.0)
Lymphs Abs: 0.9 10*3/uL (ref 0.7–4.0)
MCHC: 33.3 g/dL (ref 30.0–36.0)
MCV: 88.9 fl (ref 78.0–100.0)
MONOS PCT: 11.2 % (ref 3.0–12.0)
Monocytes Absolute: 0.3 10*3/uL (ref 0.1–1.0)
NEUTROS ABS: 1.7 10*3/uL (ref 1.4–7.7)
NEUTROS PCT: 54.7 % (ref 43.0–77.0)
Platelets: 213 10*3/uL (ref 150.0–400.0)
RBC: 3.62 Mil/uL — AB (ref 3.87–5.11)
RDW: 12.6 % (ref 11.5–15.5)
WBC: 3.1 10*3/uL — ABNORMAL LOW (ref 4.0–10.5)

## 2014-08-17 LAB — MAGNESIUM: MAGNESIUM: 2 mg/dL (ref 1.5–2.5)

## 2014-08-17 LAB — BASIC METABOLIC PANEL
BUN: 10 mg/dL (ref 6–23)
CALCIUM: 9 mg/dL (ref 8.4–10.5)
CO2: 29 mEq/L (ref 19–32)
Chloride: 104 mEq/L (ref 96–112)
Creatinine, Ser: 0.9 mg/dL (ref 0.4–1.2)
GFR: 87.23 mL/min (ref >60.00)
Glucose, Bld: 92 mg/dL (ref 70–99)
POTASSIUM: 4.2 meq/L (ref 3.5–5.1)
SODIUM: 139 meq/L (ref 135–145)

## 2014-08-17 LAB — TSH: TSH: 1.66 u[IU]/mL (ref 0.35–4.50)

## 2014-08-17 LAB — C-REACTIVE PROTEIN

## 2014-08-17 LAB — SEDIMENTATION RATE: Sed Rate: 46 mm/hr — ABNORMAL HIGH (ref 0–22)

## 2014-08-17 MED ORDER — LORAZEPAM 0.5 MG PO TABS: ORAL_TABLET | ORAL | Status: DC

## 2014-08-17 NOTE — Progress Notes (Signed)
Patient ID: Patricia Collins, female   DOB: 1971-08-10, 43 y.o.   MRN: [id number]   Subjective:    Patient ID: Patricia Collins, female    DOB: Feb 24, 1971, 43 y.o.   MRN: [id number]  HPI  Right sided facial twitching which started Wednesday or Thursday. Yesterday reports right sided headache. These symptoms have not happened before. No hx of bell's palsy. She is feeling fatigued as well. No visual disturbances. She is concerned that this may be a blood clot in her brain. She denies weakness or paralysis. No fever or chills. History of seizures age 39. No further history of seizures since.  Past Medical History  Diagnosis Date  . Depression   . Endometriosis   . Migraine     improved with diet  . Bronchitis   . Seizures     43YO, phenobarbital in the past    Current Outpatient Prescriptions on File Prior to Visit  Medication Sig Dispense Refill  . Flunisolide HFA (AEROSPAN) 80 MCG/ACT AERS Inhale 1 puff into the lungs 2 (two) times daily.      Marland Kitchen Spacer/Aero-Holding Chambers (AEROCHAMBER MV) inhaler Use as instructed  1 each  0   No current facility-administered medications on file prior to visit.     Review of Systems  Constitutional: Positive for fatigue. Negative for fever and chills.  HENT: Negative for congestion, facial swelling and rhinorrhea.   Eyes: Negative for visual disturbance.  Musculoskeletal:       Muscle twitching on right side of face. Reports twitching entire right side  Neurological: Positive for light-headedness and headaches. Negative for dizziness, seizures, syncope, facial asymmetry, speech difficulty and weakness.       Right sided facial twitching.  Psychiatric/Behavioral: Negative.   All other systems reviewed and are negative.      Objective:  BP 107/72  Pulse 76  Temp(Src) 98.3 F (36.8 C) (Oral)  Resp 14  Wt 196 lb 8 oz (89.132 kg)  SpO2 99%   Physical Exam  Constitutional: She is oriented to person, place, and time. No distress.    Cardiovascular: Normal rate and regular rhythm.   Pulmonary/Chest: Effort normal. No respiratory distress.  Musculoskeletal: Normal range of motion.  Neurological: She is alert and oriented to person, place, and time. She displays no atrophy and no tremor. No cranial nerve deficit or sensory deficit. She exhibits normal muscle tone. She displays no seizure activity. Coordination and gait normal.  Skin: Skin is warm and dry.  Psychiatric: She has a normal mood and affect. Her behavior is normal. Judgment and thought content normal.      Assessment & Plan:   1. Facial twitching None observed during clinic. She is concerned because these are occuring daily. Yesterday with pain right side of head. Check labs. Will send her for MRI brain to r/o aneurysm, AVM, tumor that may be causing compression of facial nerve. Follow up with her PCP in 1-2 weeks.  - Basic metabolic panel - TSH - CBC with Differential - C-reactive protein - Sedimentation rate - MR Brain W Wo Contrast; Future - Magnesium

## 2014-08-17 NOTE — Telephone Encounter (Signed)
Received a call from [facility] MRI department. Caller stated that they were unable to do the MRI on patient because she was severally claustrophobia. Caller requesting that a sedative be sent in to her pharmacy. Patient verbalized understanding that she will need a driver to take her to the appointment and drive her home.

## 2014-08-17 NOTE — Patient Instructions (Signed)
  Please have labs drawn prior to leaving the office.  We will contact you with results once available.  I am sending you for an MRI brain. Please see Patricia Collins for scheduling  Schedule a follow up appointment with Dr. Dan Humphreys for 1-2 weeks.

## 2014-08-18 ENCOUNTER — Ambulatory Visit: Payer: Self-pay | Admitting: Adult Health

## 2014-08-19 ENCOUNTER — Telehealth: Payer: Self-pay

## 2014-08-19 NOTE — Telephone Encounter (Signed)
Spoke to patient to notify of Dr. Tilman Neat comments. Patient verbalized understanding. Patient stated she does not have a neurologist and is willing to go to anyone you refer her to. Patient stated she really isn't feeling well and she's scared that something is seriously wrong. I told patient if she is feeling that way she should go to the E.R. Patient stated that's probably what she will do.

## 2014-08-19 NOTE — Telephone Encounter (Signed)
Patient was seen by Patricia Collins on 08/17/14 with c/o facial twitches, headache and fatigue. Patient left a message on my voicemail stating her symptoms are getting worse. I just received the results from her MRI. Can you please review her results and advise?

## 2014-08-19 NOTE — Telephone Encounter (Signed)
Yes, I agree if symptoms getting worse should go to ED now, as may be having seizure symptoms which would not be seen on MRI.

## 2014-08-19 NOTE — Telephone Encounter (Signed)
MRI brain was normal with no findings to explain symptoms. I would recommend that we set up follow up with neurology. Does she have a neurologist?

## 2014-08-25 ENCOUNTER — Ambulatory Visit: Payer: Self-pay | Admitting: Physician Assistant

## 2014-08-26 ENCOUNTER — Telehealth: Payer: Self-pay | Admitting: Adult Health

## 2014-08-26 NOTE — Telephone Encounter (Signed)
Normal MRI

## 2014-09-01 ENCOUNTER — Encounter: Payer: Self-pay | Admitting: Adult Health

## 2014-09-13 ENCOUNTER — Ambulatory Visit: Payer: BC Managed Care – PPO | Admitting: Internal Medicine

## 2014-09-14 ENCOUNTER — Encounter: Payer: Self-pay | Admitting: Family Medicine

## 2014-09-14 ENCOUNTER — Ambulatory Visit (INDEPENDENT_AMBULATORY_CARE_PROVIDER_SITE_OTHER): Payer: BC Managed Care – PPO | Admitting: Family Medicine

## 2014-09-14 VITALS — BP 100/64 | HR 76 | Temp 98.4°F | Ht 67.0 in | Wt 196.0 lb

## 2014-09-14 DIAGNOSIS — F4323 Adjustment disorder with mixed anxiety and depressed mood: Secondary | ICD-10-CM

## 2014-09-14 DIAGNOSIS — G514 Facial myokymia: Secondary | ICD-10-CM

## 2014-09-14 DIAGNOSIS — G518 Other disorders of facial nerve: Secondary | ICD-10-CM

## 2014-09-14 NOTE — Patient Instructions (Signed)
Stop at check out - we will work on [employer] about your neurology referral We will also refer you to a counselor  Keep your appointment with Dr Dan HumphreysWalker tomorrow

## 2014-09-14 NOTE — Progress Notes (Signed)
Subjective:    Patient ID: Patricia Collins, female    DOB: 1971-08-03, 43 y.o.   MRN: [id number]  HPI Here with symptoms of nervousness   Like she is "quivering" on the inside Sometimes her hands shake She feels numb  Makes it hard to concentrate     (esp at work)  She feels this way all the time - and then there are episodes when it worsens   Thinks she had drooping of her face at one point -  ? Unsure Family has not noted it    Has a hx of facial tics  Side of face feels numb at times  Occasional headaches   Had an episode at work today  L hand numb/ face funny/ facial tic   MRI that was normal   Was going to be referred to neurology - by her PCP Has been to urgent care  [place]   Had a hysterectomy when she was [age]  It was a total hysterectomy  Not on any HRT   3 years ago when her brother was killed in the [employer]  She almost had "a mental breakdown"- per her family  She does not like to talk about it  Took her out of work 3-4 months  She did go for counseling briefly   Not much appetite  Gets less than 3-4 hours of sleep - lays down but very hard to fall asleep  Going on 2 years or so   She went to Urgent care in [place] - did blood work and dx her with depression  Was given medication  One was citalopram and she was afraid to take it -- also the pt did not like her  Also given clonazepam 1 mg - given 1/2 pill - tried it and it made her drowsy and she did not take it any more    Office Visit on 08/17/2014  Component Date Value Ref Range Status  . Sodium 08/17/2014 139  135 - 145 mEq/L Final  . Potassium 08/17/2014 4.2  3.5 - 5.1 mEq/L Final  . Chloride 08/17/2014 104  96 - 112 mEq/L Final  . CO2 08/17/2014 29  19 - 32 mEq/L Final  . Glucose, Bld 08/17/2014 92  70 - 99 mg/dL Final  . BUN 40/98/119109/01/2014 10  6 - 23 mg/dL Final  . Creatinine, Ser 08/17/2014 0.9  0.4 - 1.2 mg/dL Final  . Calcium 47/82/956209/01/2014 9.0  8.4 - 10.5  mg/dL Final  . GFR 13/08/657809/01/2014 87.23  >60.00 mL/min Final  . TSH 08/17/2014 1.66  0.35 - 4.50 uIU/mL Final  . WBC 08/17/2014 3.1* 4.0 - 10.5 K/uL Final  . RBC 08/17/2014 3.62* 3.87 - 5.11 Mil/uL Final  . Hemoglobin 08/17/2014 10.7* 12.0 - 15.0 g/dL Final  . HCT 46/96/295209/01/2014 32.2* 36.0 - 46.0 % Final  . MCV 08/17/2014 88.9  78.0 - 100.0 fl Final  . MCHC 08/17/2014 33.3  30.0 - 36.0 g/dL Final  . RDW 84/13/244009/01/2014 12.6  11.5 - 15.5 % Final  . Platelets 08/17/2014 213.0  150.0 - 400.0 K/uL Final  . Neutrophils Relative % 08/17/2014 54.7  43.0 - 77.0 % Final  . Lymphocytes Relative 08/17/2014 28.8  12.0 - 46.0 % Final  . Monocytes Relative 08/17/2014 11.2  3.0 - 12.0 % Final  . Eosinophils Relative 08/17/2014 4.6  0.0 - 5.0 % Final  . Basophils Relative 08/17/2014 0.7  0.0 - 3.0 % Final  . Neutro  Abs 08/17/2014 1.7  1.4 - 7.7 K/uL Final  . Lymphs Abs 08/17/2014 0.9  0.7 - 4.0 K/uL Final  . Monocytes Absolute 08/17/2014 0.3  0.1 - 1.0 K/uL Final  . Eosinophils Absolute 08/17/2014 0.1  0.0 - 0.7 K/uL Final  . Basophils Absolute 08/17/2014 0.0  0.0 - 0.1 K/uL Final  . CRP 08/17/2014 <0.5  0.5 - 20.0 mg/dL Final  . Sed Rate 09/81/1914 46* 0 - 22 mm/hr Final  . Magnesium 08/17/2014 2.0  1.5 - 2.5 mg/dL Final    Review of Systems Review of Systems  Constitutional: Negative for fever, and unexpected weight change.  ENT neg for uri symptoms pos for tingling in face in different areas and eye twitch Eyes: Negative for pain and visual disturbance. pos for eye twitch Respiratory: Negative for cough and shortness of breath.   Cardiovascular: Negative for cp or palpitations   (pos for rapid heartbeat when nervous) Gastrointestinal: Negative for nausea, diarrhea and constipation.  Genitourinary: Negative for urgency and frequency.  Skin: Negative for pallor or rash   Neurological: pos for generalized weakness, light-headedness, numbness and headaches. (all episodic)  Hematological: Negative for  adenopathy. Does not bruise/bleed easily.  Psychiatric/Behavioral: pos for symptoms of anxiety and depression and panic attacks , neg for  SI Objective:   Physical Exam  Constitutional: She appears well-developed and well-nourished. No distress.  Pt is not distressed but extremely anxious and difficult to get a history from   HENT:  Head: Normocephalic and atraumatic.  Right Ear: External ear normal.  Left Ear: External ear normal.  Nose: Nose normal.  Mouth/Throat: Oropharynx is clear and moist.  No facial droop  Eyes: Conjunctivae and EOM are normal. Pupils are equal, round, and reactive to light. Right eye exhibits no discharge. Left eye exhibits no discharge. No scleral icterus.  Neck: Normal range of motion. Neck supple. No JVD present. Carotid bruit is not present. No thyromegaly present.  Cardiovascular: Normal rate, regular rhythm, normal heart sounds and intact distal pulses.  Exam reveals no gallop.   Pulmonary/Chest: Effort normal and breath sounds normal. No respiratory distress. She has no wheezes. She has no rales. She exhibits no tenderness.  Abdominal: Soft. Bowel sounds are normal. She exhibits no distension and no mass. There is no tenderness.  Musculoskeletal: She exhibits no edema and no tenderness.  Lymphadenopathy:    She has no cervical adenopathy.  Neurological: She is alert. She has normal strength and normal reflexes. She displays tremor. She displays no atrophy. No cranial nerve deficit or sensory deficit. She exhibits normal muscle tone. She displays a negative Romberg sign. She displays no seizure activity. Coordination and gait normal.  Nl gait- tandem/heel/toe Mild hand tremor that is symmetric  No facial droop   No eye twitching observed   Skin: Skin is warm and dry. No rash noted. No erythema. No pallor.  Psychiatric: Her mood appears anxious. Her affect is blunt. Her speech is delayed. She is withdrawn. Thought content is not paranoid. She exhibits a  depressed mood. She expresses no homicidal and no suicidal ideation.  Difficult to get history from patient  She is irritable (? Somewhat angry)- and quiet and withdrawn  Tearful at times -esp when disc death of her brother  Supportive family present           Assessment & Plan:   Problem List Items Addressed This Visit     Other   Facial twitching     This continues along with subjective  numbness of face/ generalized weakness/ presyncopal sensation - all episodic and intermittent  Pt unsure if facial droop occurred -no one witnessed it  Reassuring exam today  Nl MRI also reassuring  She is anxious to get going with neuro ref- will call her PCP office today to aid in this   I do suspect anxiety plays a role at this time       Adjustment disorder with mixed anxiety and depressed mood - Primary     Difficult to say whether this is causing her physical/neurol symptoms or vice versa or both -disc this in detail Episodes do sound like panic attacks Pt is too afraid to take citalopram and also took only 1/2 dose of klonopin -stating it was too sedating and also scared her  She is agreeable to counseling-and was referred  Reviewed stressors/ coping techniques/symptoms/ support sources/ tx options and side effects in detail today  Update if not starting to improve in a week or if worsening   Will forward to her PCP who is supposed to see her tomorrow      Relevant Orders      Ambulatory referral to Psychology

## 2014-09-14 NOTE — Progress Notes (Signed)
Pre visit review using our clinic review tool, if applicable. No additional management support is needed unless otherwise documented below in the visit note. 

## 2014-09-15 ENCOUNTER — Encounter: Payer: Self-pay | Admitting: Internal Medicine

## 2014-09-15 ENCOUNTER — Telehealth: Payer: Self-pay | Admitting: Internal Medicine

## 2014-09-15 ENCOUNTER — Ambulatory Visit (INDEPENDENT_AMBULATORY_CARE_PROVIDER_SITE_OTHER): Payer: BC Managed Care – PPO | Admitting: Internal Medicine

## 2014-09-15 VITALS — BP 112/68 | HR 75 | Temp 98.2°F | Ht 67.0 in | Wt 194.5 lb

## 2014-09-15 DIAGNOSIS — G514 Facial myokymia: Secondary | ICD-10-CM

## 2014-09-15 DIAGNOSIS — F4323 Adjustment disorder with mixed anxiety and depressed mood: Secondary | ICD-10-CM

## 2014-09-15 MED ORDER — ALPRAZOLAM 0.25 MG PO TABS: 0.2500 mg | ORAL_TABLET | Freq: Two times a day (BID) | ORAL | Status: DC | PRN

## 2014-09-15 MED ORDER — FLUOXETINE HCL 20 MG PO TABS: 20.0000 mg | ORAL_TABLET | Freq: Every day | ORAL | Status: AC

## 2014-09-15 NOTE — Telephone Encounter (Signed)
Pt needs 2wk recheck. Please advise where to add to schedule.msn

## 2014-09-15 NOTE — Patient Instructions (Signed)
Start Fluoxetine 20mg  daily.  Start Alprazolam 0.25mg  up to three times daily as needed for panic attacks or anxiety.  We will set you up with a counselor and neurology.

## 2014-09-15 NOTE — Progress Notes (Signed)
Subjective:    Patient ID: Patricia Collins, female    DOB: May 03, 1971, 43 y.o.   MRN: [id number]  HPI 43YO female presents for acute visit.  Seen by NP early 08/2014 with facial spasms and anxiety. Had MRI brain which was normal. Started on Lorazepam with some improvement in symptoms. Also went to urgent care because of both facial twitching and anxiety and given Rx for Citalopram, but never started. Continues to have right sided facial spasms. Can see face twitching at times. Feels like pulling but not painful.   Having panic attacks frequently at home, work and while driving. Feels short of breath during these events. Has called EMS on several occasions. Has called her mother on several occasions to pick her up from work.  Seen by Dr. Milinda Antisower yesterday, and referral made to psychologist.  Friends note that she seems depressed. Notes general apathy. No major recent life changes. Reports she feels safe at home and at work. Tearful describing frustration with current panic attacks and facial twitching. Will contract for safety. Frequently calls her mother for support.  Asks about FMLA paperwork to get time off from work to help deal with anxiety.   Review of Systems  Constitutional: Negative for fever, chills, appetite change, fatigue and unexpected weight change.  Eyes: Negative for visual disturbance.  Respiratory: Positive for shortness of breath (with episodes of panic).   Cardiovascular: Negative for chest pain, palpitations and leg swelling.  Gastrointestinal: Negative for abdominal pain.  Skin: Negative for color change and rash.  Neurological: Positive for tremors (facial twitching) and headaches (occasional, described as mild). Negative for dizziness, syncope, facial asymmetry, speech difficulty, weakness, light-headedness and numbness.  Hematological: Negative for adenopathy. Does not bruise/bleed easily.  Psychiatric/Behavioral: Positive for behavioral problems, sleep  disturbance, dysphoric mood, decreased concentration and agitation. Negative for suicidal ideas. The patient is nervous/anxious.        Objective:    BP 112/68  Pulse 75  Temp(Src) 98.2 F (36.8 C) (Oral)  Ht 5\' 7"  (1.702 m)  Wt 194 lb 8 oz (88.225 kg)  BMI 30.46 kg/m2  SpO2 99% Physical Exam  Constitutional: She is oriented to person, place, and time. She appears well-developed and well-nourished. No distress.  HENT:  Head: Normocephalic and atraumatic.  Right Ear: External ear normal.  Left Ear: External ear normal.  Nose: Nose normal.  Mouth/Throat: Oropharynx is clear and moist.  Eyes: Conjunctivae are normal. Pupils are equal, round, and reactive to light. Right eye exhibits no discharge. Left eye exhibits no discharge. No scleral icterus.  Neck: Normal range of motion. Neck supple. No tracheal deviation present. No thyromegaly present.  Cardiovascular: Normal rate, regular rhythm, normal heart sounds and intact distal pulses.  Exam reveals no gallop and no friction rub.   No murmur heard. Pulmonary/Chest: Effort normal and breath sounds normal. No accessory muscle usage. Not tachypneic. No respiratory distress. She has no decreased breath sounds. She has no wheezes. She has no rhonchi. She has no rales. She exhibits no tenderness.  Musculoskeletal: Normal range of motion. She exhibits no edema and no tenderness.  Lymphadenopathy:    She has no cervical adenopathy.  Neurological: She is alert and oriented to person, place, and time. No cranial nerve deficit. She exhibits normal muscle tone. Coordination normal.  Skin: Skin is warm and dry. No rash noted. She is not diaphoretic. No erythema. No pallor.  Psychiatric: Her speech is normal and behavior is normal. Judgment and thought content normal. Her mood appears  anxious. Her affect is labile. Cognition and memory are normal. She exhibits a depressed mood. She expresses no suicidal ideation.          Assessment & Plan:    Problem List Items Addressed This Visit     Unprioritized   Adjustment disorder with mixed anxiety and depressed mood     Recent marked increase in anxiety and depressed mood. Pt denies any trigger for this. She has declined to start Citalopram as prescribed at urgent care. Has drowsiness with Lorazepam and clonazepam. Will start Fluoxetine and prn alprazolam for episodes of panic. Referral in progress (entered by Dr. Milinda Antis yesterday) for psychologist. She will contract for safety. Follow up in 2 weeks and prn.    Relevant Medications      FLUoxetine (PROZAC) tablet      ALPRAZolam  (XANAX) tablet   Facial twitching - Primary     Exam is normal today. Recent MRI brain was normal. Symptoms may be related to anxiety. Will, however set up neurology evaluation to see if any additional testing would be helpful.    Relevant Orders      Ambulatory referral to Neurology       Return in about 2 weeks (around 09/29/2014) for Recheck.

## 2014-09-15 NOTE — Assessment & Plan Note (Signed)
Exam is normal today. Recent MRI brain was normal. Symptoms may be related to anxiety. Will, however set up neurology evaluation to see if any additional testing would be helpful.

## 2014-09-15 NOTE — Assessment & Plan Note (Signed)
This continues along with subjective numbness of face/ generalized weakness/ presyncopal sensation - all episodic and intermittent  Pt unsure if facial droop occurred -no one witnessed it  Reassuring exam today  Nl MRI also reassuring  She is anxious to get going with neuro ref- will call her PCP office today to aid in this   I do suspect anxiety plays a role at this time

## 2014-09-15 NOTE — Telephone Encounter (Signed)
Pt dropped off FMLA forms to be filled out. Please advise when ready to be picked up.msn

## 2014-09-15 NOTE — Progress Notes (Signed)
Pre visit review using our clinic review tool, if applicable. No additional management support is needed unless otherwise documented below in the visit note. 

## 2014-09-15 NOTE — Assessment & Plan Note (Addendum)
Difficult to say whether this is causing her physical/neurol symptoms or vice versa or both -disc this in detail Episodes do sound like panic attacks Pt is too afraid to take citalopram and also took only 1/2 dose of klonopin -stating it was too sedating and also scared her  She is agreeable to counseling-and was referred  Reviewed stressors/ coping techniques/symptoms/ support sources/ tx options and side effects in detail today  Update if not starting to improve in a week or if worsening   Will forward to her PCP who is supposed to see her tomorrow   >25 minutes spent in face to face time with patient, >50% spent in counselling or coordination of care

## 2014-09-15 NOTE — Telephone Encounter (Signed)
Please advise on appt.  

## 2014-09-15 NOTE — Telephone Encounter (Signed)
Monday Oct 12th at 3:30

## 2014-09-15 NOTE — Assessment & Plan Note (Signed)
Recent marked increase in anxiety and depressed mood. Pt denies any trigger for this. She has declined to start Citalopram as prescribed at urgent care. Has drowsiness with Lorazepam and clonazepam. Will start Fluoxetine and prn alprazolam for episodes of panic. Referral in progress (entered by Dr. Milinda Antisower yesterday) for psychologist. She will contract for safety. Follow up in 2 weeks and prn.

## 2014-09-16 DIAGNOSIS — Z7689 Persons encountering health services in other specified circumstances: Secondary | ICD-10-CM

## 2014-09-16 NOTE — Telephone Encounter (Signed)
FMLA forms completed and faxed to [employer] @ [phone]

## 2014-09-26 ENCOUNTER — Ambulatory Visit (INDEPENDENT_AMBULATORY_CARE_PROVIDER_SITE_OTHER): Payer: BC Managed Care – PPO | Admitting: Internal Medicine

## 2014-09-26 ENCOUNTER — Encounter: Payer: Self-pay | Admitting: Internal Medicine

## 2014-09-26 VITALS — BP 98/60 | HR 81 | Temp 98.6°F | Ht 67.0 in | Wt 195.5 lb

## 2014-09-26 DIAGNOSIS — G514 Facial myokymia: Secondary | ICD-10-CM

## 2014-09-26 DIAGNOSIS — F4323 Adjustment disorder with mixed anxiety and depressed mood: Secondary | ICD-10-CM

## 2014-09-26 MED ORDER — ALPRAZOLAM 0.25 MG PO TABS: 0.2500 mg | ORAL_TABLET | Freq: Three times a day (TID) | ORAL | Status: DC | PRN

## 2014-09-26 NOTE — Assessment & Plan Note (Signed)
Persistent right facial twitching described by pt. No observed tremor on exam. We discussed potential causes for this. Encouraged her to continue prn alprazolam. Encouraged her to follow up with neurology as scheduled.

## 2014-09-26 NOTE — Patient Instructions (Addendum)
Try using Alprazolam at night to help initiate sleep.  Use Miralax 17gm daily to help with constipation.  Follow up in 5 weeks.

## 2014-09-26 NOTE — Assessment & Plan Note (Signed)
Symptoms have improved some with addition of fluoxetine and prn alprazolam. Encouraged her to keep evaluation with psychology on 10/19 and with neurology in 10/2014. Will increase alprazolam to tid, and use up to 0.5mg  at bedtime to help initiate sleep. Encouraged her to continue to increase her physical activity during the day. Follow up in 5 weeks, after evaluation with psychology and neurology complete, and prn.

## 2014-09-26 NOTE — Progress Notes (Signed)
Subjective:    Patient ID: Patricia Collins, female    DOB: [birth year], 43 y.o.   MRN: [id number]  HPI 43YO female presents for follow up.  She was recently evaluated for anxiety and depression. Started on Fluoxetine and alprazolam prn. Referral made to psychiatry. Anxiety has improved some. Continues to have right sided facial twitching. Neurology appointment is pending, as is appointment with psychologist. Alprazolam has been helping with anxiety and facial twitching. Taking up to twice daily. Also notes feeling thirsty and constipated recently. Has tried to increase fluid intake. Trying to walk during the day and increase physical activity. Having some trouble falling asleep. FMLA was approved and she has taken some time off work.  Review of Systems  Constitutional: Positive for fatigue. Negative for fever, chills, appetite change and unexpected weight change.  Eyes: Negative for visual disturbance.  Respiratory: Negative for shortness of breath.   Cardiovascular: Negative for chest pain and leg swelling.  Gastrointestinal: Negative for abdominal pain.  Skin: Negative for color change and rash.  Hematological: Negative for adenopathy. Does not bruise/bleed easily.  Psychiatric/Behavioral: Positive for sleep disturbance and dysphoric mood. Negative for suicidal ideas. The patient is nervous/anxious.        Objective:    BP 98/60  Pulse 81  Temp(Src) 98.6 F (37 C) (Oral)  Ht 5\' 7"  (1.702 m)  Wt 195 lb 8 oz (88.678 kg)  BMI 30.61 kg/m2  SpO2 95% Physical Exam  Constitutional: She is oriented to person, place, and time. She appears well-developed and well-nourished. No distress.  HENT:  Head: Normocephalic and atraumatic.  Right Ear: External ear normal.  Left Ear: External ear normal.  Nose: Nose normal.  Mouth/Throat: Oropharynx is clear and moist. No oropharyngeal exudate.  Eyes: Conjunctivae are normal. Pupils are equal, round, and reactive to light. Right eye exhibits  no discharge. Left eye exhibits no discharge. No scleral icterus.  Neck: Normal range of motion. Neck supple. No tracheal deviation present. No thyromegaly present.  Cardiovascular: Normal rate, regular rhythm, normal heart sounds and intact distal pulses.  Exam reveals no gallop and no friction rub.   No murmur heard. Pulmonary/Chest: Effort normal and breath sounds normal. No accessory muscle usage. Not tachypneic. No respiratory distress. She has no decreased breath sounds. She has no wheezes. She has no rhonchi. She has no rales. She exhibits no tenderness.  Musculoskeletal: Normal range of motion. She exhibits no edema and no tenderness.  Lymphadenopathy:    She has no cervical adenopathy.  Neurological: She is alert and oriented to person, place, and time. No cranial nerve deficit. She exhibits normal muscle tone. Coordination normal.  Skin: Skin is warm and dry. No rash noted. She is not diaphoretic. No erythema. No pallor.  Psychiatric: She has a normal mood and affect. Her speech is normal and behavior is normal. Judgment and thought content normal. Her mood appears not anxious. Cognition and memory are normal. She does not exhibit a depressed mood. She expresses no suicidal ideation.          Assessment & Plan:   Problem List Items Addressed This Visit     Unprioritized   Adjustment disorder with mixed anxiety and depressed mood - Primary     Symptoms have improved some with addition of fluoxetine and prn alprazolam. Encouraged her to keep evaluation with psychology on 10/19 and with neurology in 10/2014. Will increase alprazolam to tid, and use up to 0.5mg  at bedtime to help initiate sleep. Encouraged her to continue  to increase her physical activity during the day. Follow up in 5 weeks, after evaluation with psychology and neurology complete, and prn.    Relevant Medications      ALPRAZolam  (XANAX) tablet   Facial twitching     Persistent right facial twitching described by  pt. No observed tremor on exam. We discussed potential causes for this. Encouraged her to continue prn alprazolam. Encouraged her to follow up with neurology as scheduled.        Return in about 5 weeks (around 10/31/2014).

## 2014-09-26 NOTE — Progress Notes (Signed)
Pre visit review using our clinic review tool, if applicable. No additional management support is needed unless otherwise documented below in the visit note. 

## 2014-09-30 ENCOUNTER — Encounter: Payer: Self-pay | Admitting: Adult Health

## 2014-10-03 ENCOUNTER — Ambulatory Visit (INDEPENDENT_AMBULATORY_CARE_PROVIDER_SITE_OTHER): Payer: BC Managed Care – PPO | Admitting: Psychology

## 2014-10-03 DIAGNOSIS — F4323 Adjustment disorder with mixed anxiety and depressed mood: Secondary | ICD-10-CM

## 2014-10-05 ENCOUNTER — Ambulatory Visit: Payer: BC Managed Care – PPO | Admitting: Internal Medicine

## 2014-10-06 ENCOUNTER — Ambulatory Visit: Payer: BC Managed Care – PPO | Admitting: Internal Medicine

## 2014-10-12 ENCOUNTER — Ambulatory Visit: Payer: BC Managed Care – PPO | Admitting: Psychology

## 2014-10-27 ENCOUNTER — Encounter: Payer: Self-pay | Admitting: Neurology

## 2014-10-27 ENCOUNTER — Ambulatory Visit (INDEPENDENT_AMBULATORY_CARE_PROVIDER_SITE_OTHER): Payer: BC Managed Care – PPO | Admitting: Neurology

## 2014-10-27 ENCOUNTER — Ambulatory Visit (INDEPENDENT_AMBULATORY_CARE_PROVIDER_SITE_OTHER): Payer: BC Managed Care – PPO | Admitting: Psychology

## 2014-10-27 VITALS — BP 126/70 | HR 70 | Temp 98.3°F | Resp 16 | Ht 67.0 in | Wt 197.3 lb

## 2014-10-27 DIAGNOSIS — F4323 Adjustment disorder with mixed anxiety and depressed mood: Secondary | ICD-10-CM

## 2014-10-27 DIAGNOSIS — G514 Facial myokymia: Secondary | ICD-10-CM

## 2014-10-27 NOTE — Patient Instructions (Signed)
Based on you description of the twitching, it seems more likely related to anxiety and not neurologic.  However, you can come back to see me off of Xanax so I can see the twitching myself.

## 2014-10-27 NOTE — Progress Notes (Signed)
NEUROLOGY CONSULTATION NOTE  Patricia Collins MRN: [id number] DOB: 07-Mar-1971  Referring provider: Dr. Dan HumphreysWalker Primary care provider: Dr. Dan HumphreysWalker  Reason for consult:  Facial twitching  HISTORY OF PRESENT ILLNESS: Patricia Collins is a 43 year old right-handed woman with history of depression and anxiety who presents for right sided facial twitching.  Records and labs personally reviewed.  In August, she began having twitching around the right eye and maxillary region, although it did not cause eye blinking.  It would be associated with numbness involving the entire left side of her body and face.  She would begin to panic when it occurred.  She says she didn't think she could suppress it.  She has called paramedics regarding this.  It would last a few minutes several times a day.  MRI of the brain with and without contrast was performed on 08/18/14.  Scan not available to review, but report showed no abnormality of the brain, IAC and facial nerve.  Lab work from 08/17/14 showed normal BMP, baseline mild leukopenia and anemia, TSH 1.66, CRP negative, Sed Rate 46, and magnesium level of 2.  For the past several weeks, she has been taking Xanax and Prozac daily.  The Xanax suppresses the episodes.  She says if she doesn't take Xanax, she would have recurrence of episodes.  Over the past week, she says she began noticing twitching involving the left side of her face as well.  She has significant depression and anxiety dating back to the death of her brother 3 years ago, when he was in the [employer].  She works in [employer] but only works one day a week due to her psychiatric conditon.  PAST MEDICAL HISTORY: Past Medical History  Diagnosis Date  . Depression   . Endometriosis   . Migraine     improved with diet  . Bronchitis   . Seizures     43YO, phenobarbital in the past    PAST SURGICAL HISTORY: Past Surgical History  Procedure Laterality Date  . Appendectomy    . Abdominal  hysterectomy  2005  . Cesarean section      x1 for fetal distress  . Dilation and curettage of uterus      MEDICATIONS: Current Outpatient Prescriptions on File Prior to Visit  Medication Sig Dispense Refill  . ALPRAZolam (XANAX) 0.25 MG tablet Take 1 tablet (0.25 mg total) by mouth 3 (three) times daily as needed for anxiety. 90 tablet 2  . Flunisolide HFA (AEROSPAN) 80 MCG/ACT AERS Inhale 1 puff into the lungs 2 (two) times daily.    Marland Kitchen. FLUoxetine (PROZAC) 20 MG tablet Take 1 tablet (20 mg total) by mouth daily. 30 tablet 3  . Spacer/Aero-Holding Chambers (AEROCHAMBER MV) inhaler Use as instructed 1 each 0   No current facility-administered medications on file prior to visit.    ALLERGIES: Allergies  Allergen Reactions  . Aspirin Shortness Of Breath  . Penicillins Shortness Of Breath  . Latex     FAMILY HISTORY: Family History  Problem Relation Age of Onset  . Stroke Mother   . Heart disease Mother     h/o rheumatic fever  . Diabetes Mother   . Hypertension Mother   . Stroke Father   . Arthritis Father   . Hypertension Brother   . Diabetes Brother     SOCIAL HISTORY: History   Social History  . Marital Status: Single    Spouse Name: N/A    Number of Children: N/A  .  Years of Education: N/A   Occupational History  . Human Resources Benefits Specialist    Social History Main Topics  . Smoking status: Never Smoker   . Smokeless tobacco: Never Used  . Alcohol Use: No  . Drug Use: No  . Sexual Activity: Not Currently   Other Topics Concern  . Not on file   Social History Narrative   [place] in [place] From [place]. Has 1 daughter.      Work - HR      Diet - healthy, limited sugars    REVIEW OF SYSTEMS: Constitutional: No fevers, chills, or sweats, no generalized fatigue, change in appetite Eyes: No visual changes, double vision, eye pain Ear, nose and throat: No hearing loss, ear pain, nasal congestion, sore throat Cardiovascular: No chest pain,  palpitations Respiratory:  No shortness of breath at rest or with exertion, wheezes GastrointestinaI: No nausea, vomiting, diarrhea, abdominal pain, fecal incontinence Genitourinary:  No dysuria, urinary retention or frequency Musculoskeletal:  No neck pain, back pain Integumentary: No rash, pruritus, skin lesions Neurological: as above Psychiatric: depression and anxiety Endocrine: No palpitations, fatigue, diaphoresis, mood swings, change in appetite, change in weight, increased thirst Hematologic/Lymphatic:  No anemia, purpura, petechiae. Allergic/Immunologic: no itchy/runny eyes, nasal congestion, recent allergic reactions, rashes  PHYSICAL EXAM: Filed Vitals:   10/27/14 0810  BP: 126/70  Pulse: 70  Temp: 98.3 F (36.8 C)  Resp: 16   General: No acute distress Head:  Normocephalic/atraumatic Eyes:  fundi unremarkable, without vessel changes, exudates, hemorrhages or papilledema. CN III, IV, VI:  full range of motion, no nystagmus, no ptosis Neck: supple, no paraspinal tenderness, full range of motion Back: No paraspinal tenderness Heart: regular rate and rhythm Lungs: Clear to auscultation bilaterally. Vascular: No carotid bruits. Neurological Exam: Mental status: alert and oriented to person, place, and time, recent and remote memory intact, fund of knowledge intact, attention and concentration intact, speech fluent and not dysarthric, language intact. Cranial nerves: CN I: not tested CN II: pupils equal, round and reactive to light, visual fields intact, fundi unremarkable, without vessel changes, exudates, hemorrhages or papilledema. CN III, IV, VI:  full range of motion, no nystagmus, no ptosis CN V: facial sensation intact CN VII: upper and lower face symmetric CN VIII: hearing intact CN IX, X: gag intact, uvula midline CN XI: sternocleidomastoid and trapezius muscles intact CN XII: tongue midline Bulk & Tone: normal, no fasciculations. Motor:  5/5  throughout Sensation:  Pinprick and vibration intact. Deep Tendon Reflexes:  2+ throughout, toes downgoing. Finger to nose testing:  No dysmetria Heel to shin:  No dysmetria Gait:  Normal station and stride.  Able to turn and walk in tandem. Romberg negative.  IMPRESSION: Facial twitching Transient numbness Depression and anxiety  She did not exhibit any twitching at today's visit.  However, based on her description, it seems psychogenic.  There is a rare condition, called hemifacial spasm.  However, the fact that she is developing twitching on the other side of her face, suggests this is psychogenic.  Blepharospasm can be bilateral, however she does not describe this.  Also, the associated numbness also suggests psychogenic etiology, especially in setting of normal MRI.  I discussed my suspicion at length with the patient and answered all questions to the best of my ability.  PLAN: I did recommend that she return when not taking the Xanax, so I may observe the twitching myself.     Thank you for allowing me to take part in the care  of this patient.  Shon Millet, DO  CC:  Ronna Polio, MD

## 2014-11-02 ENCOUNTER — Ambulatory Visit: Payer: BC Managed Care – PPO | Admitting: Internal Medicine

## 2014-11-03 ENCOUNTER — Ambulatory Visit: Payer: BC Managed Care – PPO | Admitting: Internal Medicine

## 2014-11-03 ENCOUNTER — Ambulatory Visit: Payer: BC Managed Care – PPO | Admitting: Psychology

## 2014-11-06 IMAGING — CT CT HEAD WITHOUT CONTRAST
1 series · 16 of 30 positions shown, 20 images · non-contrast
Comparison: none

REASON FOR EXAM: severev headache-iimproved now vomiting
COMMENTS:

PROCEDURE:     CT  - CT HEAD WITHOUT CONTRAST  - July 04, 2013  [DATE]
RESULT:     Comparison:  None
TECHNIQUE: Multiple axial images from the foramen magnum to the vertex were
obtained without IV contrast.

[Series 2: soft tissue · axial · 0.47mm/px · z∈[-174,-38]mm · 16 of 31 slices shown, 20 images]
[im 2/31  brain]
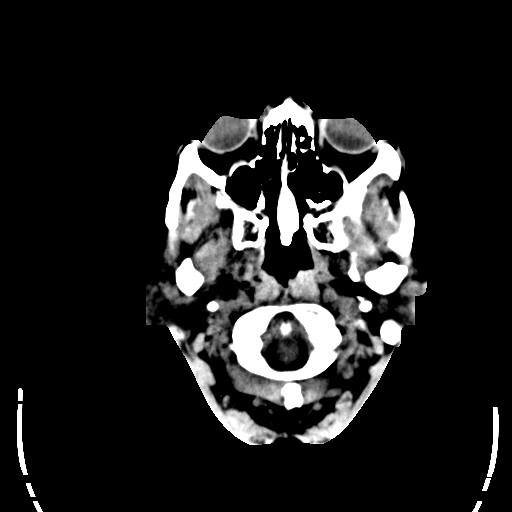
[im 2/31  bone]
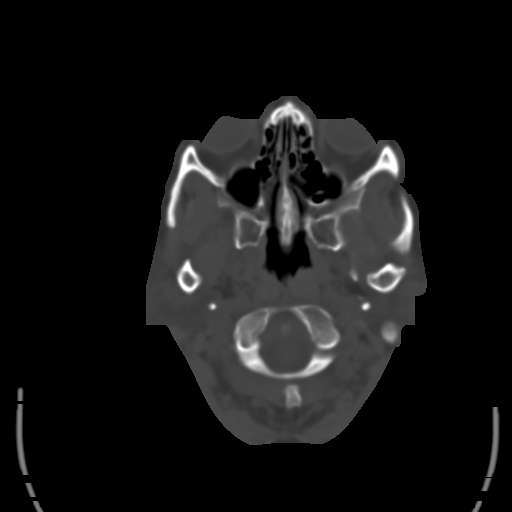
[im 4/31  brain]
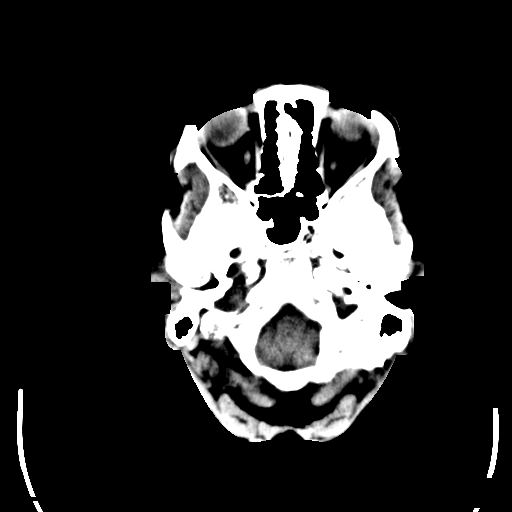
[im 6/31  brain]
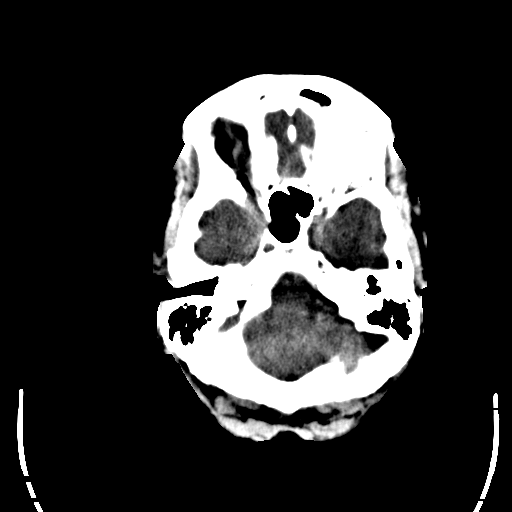
[im 8/31  brain]
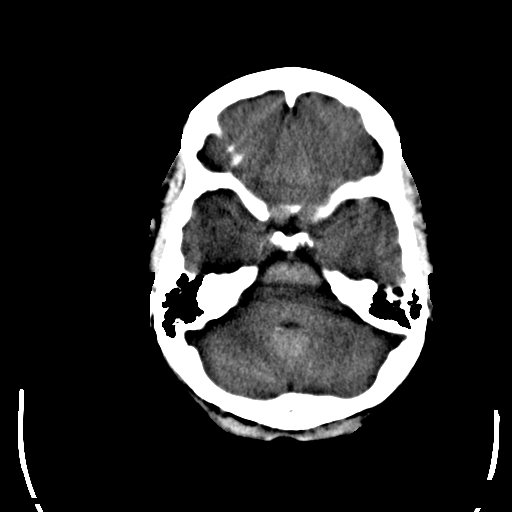
[im 9/31  brain]
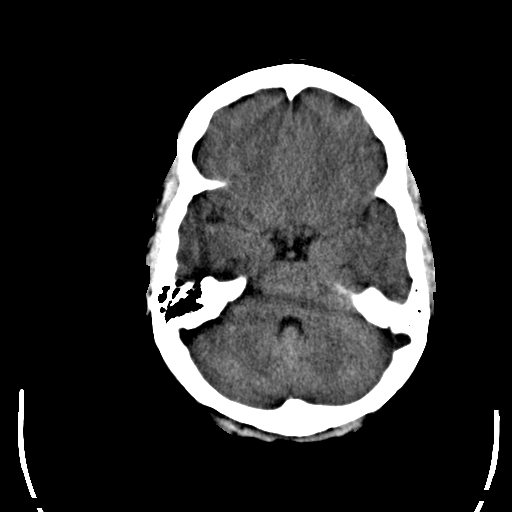
[im 9/31  bone]
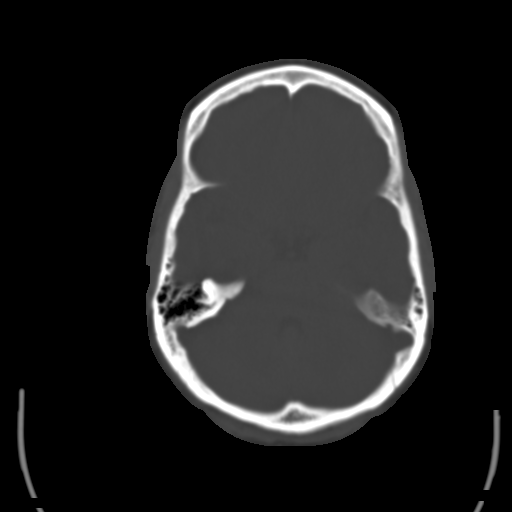
[im 11/31  brain]
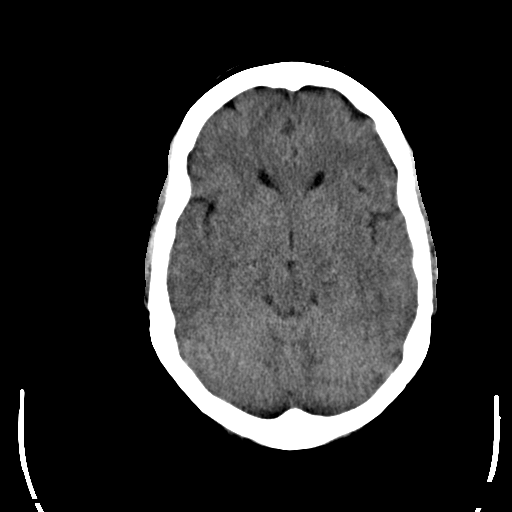
[im 13/31  brain]
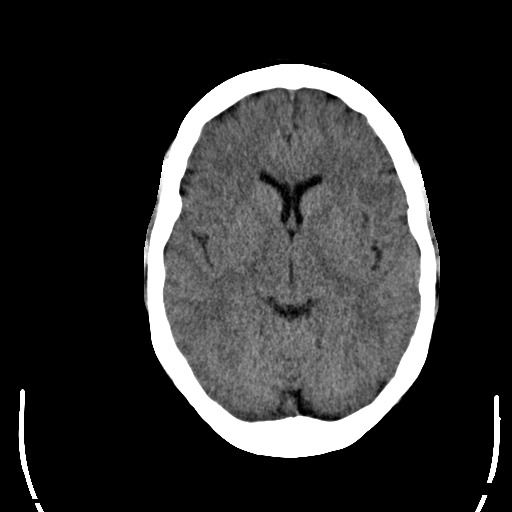
[im 15/31  brain]
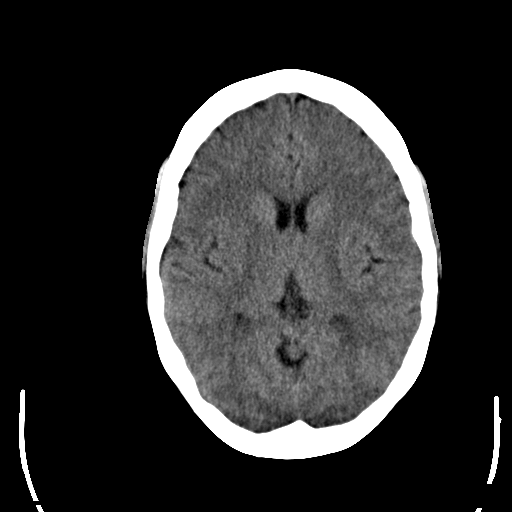
[im 16/31  brain]
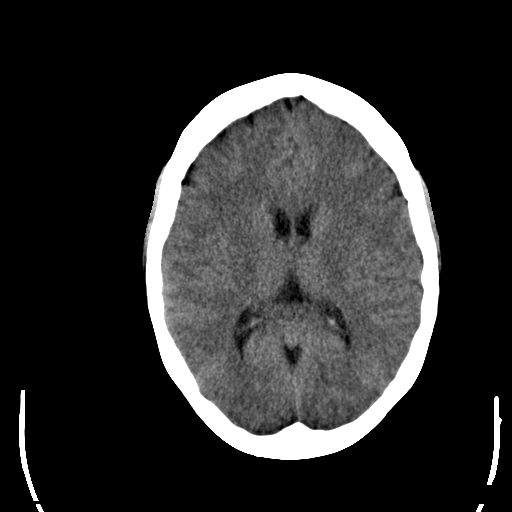
[im 16/31  bone]
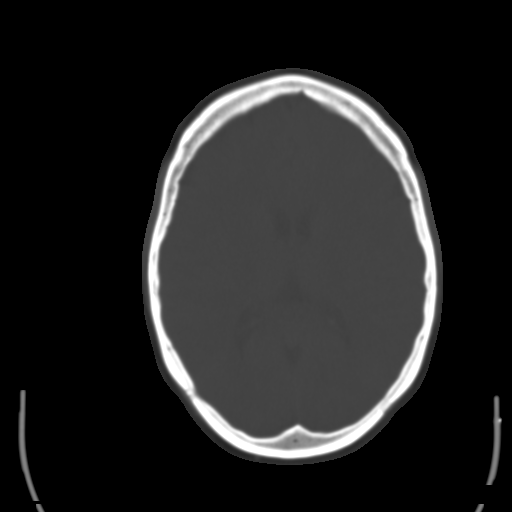
[im 18/31  brain]
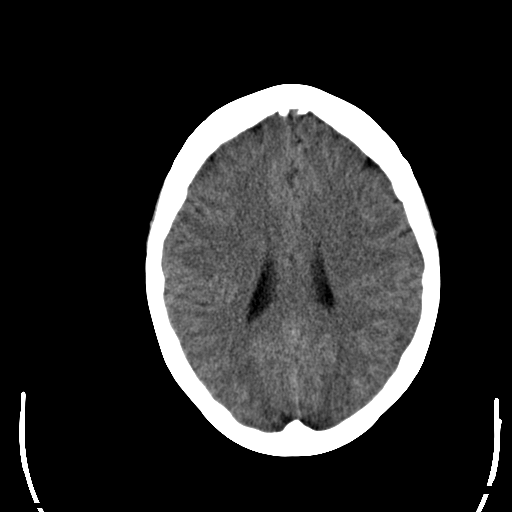
[im 20/31  brain]
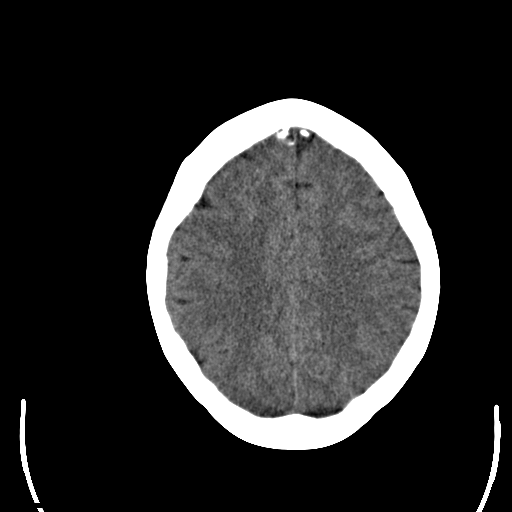
[im 22/31  brain]
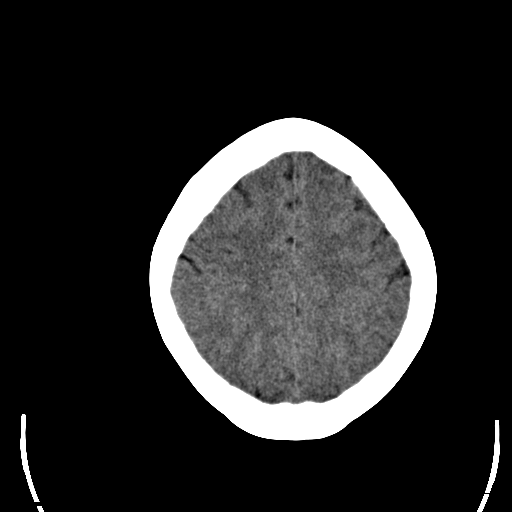
[im 23/31  brain]
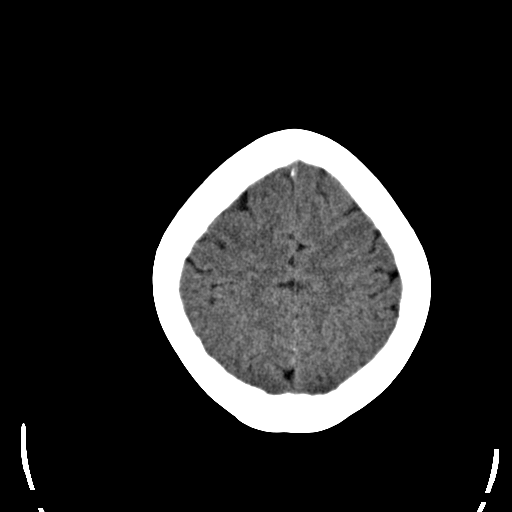
[im 23/31  bone]
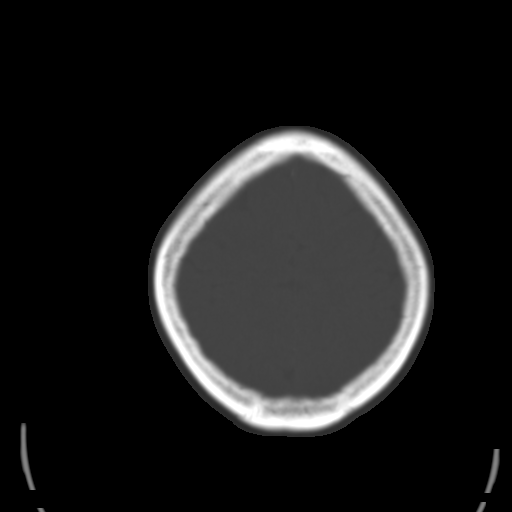
[im 25/31  brain]
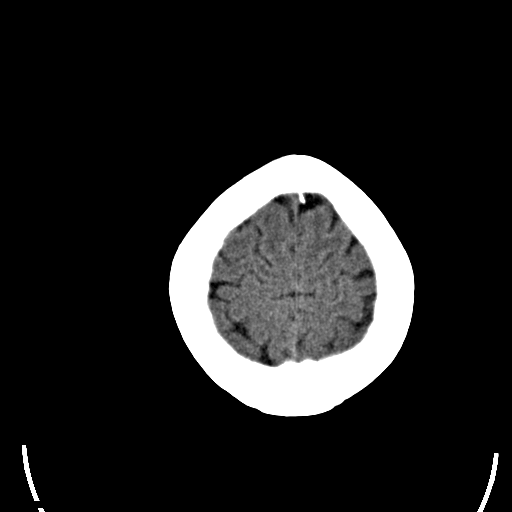
[im 27/31  brain]
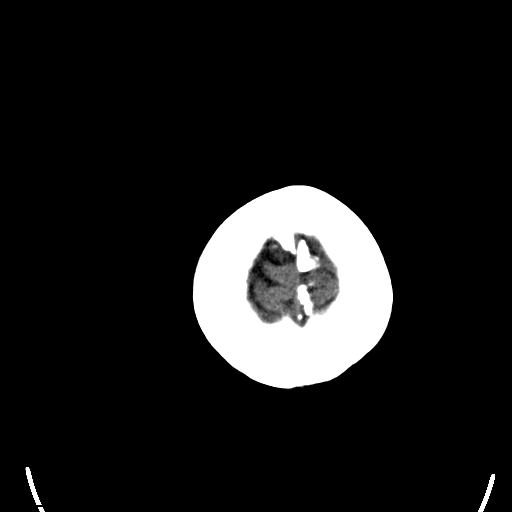
[im 29/31  brain]
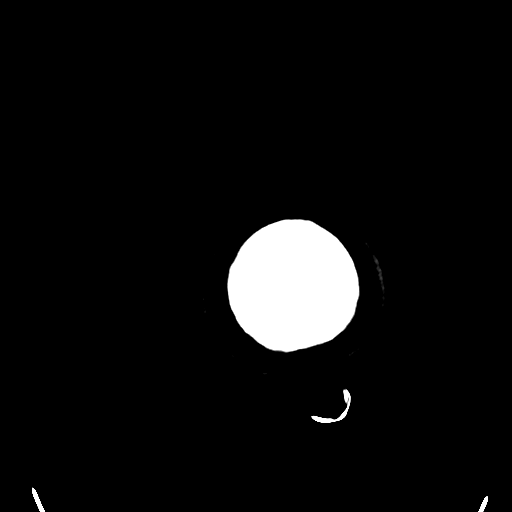

[16 of 30 positions shown; findings below may reference images not displayed]

FINDINGS: There is no evidence of mass effect, midline shift, or extra-axial fluid
collections.  There is no evidence of a space-occupying lesion or
intracranial hemorrhage. There is no evidence of a cortical-based area of
acute infarction.

The ventricles and sulci are appropriate for the patient's age. The basal
cisterns are patent.

Visualized portions of the orbits are unremarkable. The visualized portions
of the paranasal sinuses and mastoid air cells are unremarkable.

The osseous structures are unremarkable.
IMPRESSION: No acute intracranial process.

[REDACTED]

## 2014-11-30 ENCOUNTER — Telehealth: Payer: Self-pay

## 2014-11-30 NOTE — Telephone Encounter (Signed)
Called pt to schedule apt, no answer, lvmom  

## 2014-11-30 NOTE — Telephone Encounter (Signed)
She no-showed to her last visit and was supposed to follow up with psychiatry. Needs to be seen prior to refills.

## 2014-11-30 NOTE — Telephone Encounter (Signed)
Okay to refill? 

## 2014-11-30 NOTE — Telephone Encounter (Signed)
The patient called and is hoping to get her xanax rx refilled.  (last ov was October)   Callback - [phone]

## 2014-12-01 ENCOUNTER — Encounter: Payer: Self-pay | Admitting: Internal Medicine

## 2014-12-01 ENCOUNTER — Ambulatory Visit (INDEPENDENT_AMBULATORY_CARE_PROVIDER_SITE_OTHER): Payer: BC Managed Care – PPO | Admitting: Internal Medicine

## 2014-12-01 VITALS — BP 107/68 | HR 76 | Temp 98.5°F | Ht 67.0 in | Wt 193.0 lb

## 2014-12-01 DIAGNOSIS — F4323 Adjustment disorder with mixed anxiety and depressed mood: Secondary | ICD-10-CM

## 2014-12-01 MED ORDER — ALPRAZOLAM 0.25 MG PO TABS: 0.2500 mg | ORAL_TABLET | Freq: Three times a day (TID) | ORAL | Status: AC | PRN

## 2014-12-01 NOTE — Patient Instructions (Addendum)
We will set up a psychiatry evaluation.  Follow up in 4 weeks or sooner as needed.

## 2014-12-01 NOTE — Progress Notes (Signed)
Subjective:    Patient ID: Patricia Collins, female    DOB: [birth year], 43 y.o.   MRN: [id number]  HPI 43YO female presents for follow up.  Last seen 10/12 for anxiety and facial twitching. 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 Recommended that she follow up when off alprazolam for observation of twitching.  She reports she was seen by Patricia Collins psychologist.  Feeling "wierd" lately. Having trouble sleeping. Takes half Alprazolam in the evening which helps with sleep. Sometimes wakes feeling drowsy.  Feeling both anxious and depressed at times. One of her best friends just died which has been difficult. Working only one day per week. Feels mistreated by others at times.  Has had suicidal ideation in the past, but not recently.  Past medical, surgical, family and social history per today's encounter.  Review of Systems  Constitutional: Negative for fever, chills, appetite change, fatigue and unexpected weight change.  Eyes: Negative for visual disturbance.  Respiratory: Negative for shortness of breath.   Cardiovascular: Negative for chest pain and leg swelling.  Gastrointestinal: Negative for abdominal pain.  Skin: Negative for color change and rash.  Hematological: Negative for adenopathy. Does not bruise/bleed easily.  Psychiatric/Behavioral: Positive for sleep disturbance and dysphoric mood. Negative for suicidal ideas. The patient is nervous/anxious.        Objective:    BP 107/68 mmHg  Pulse 76  Temp(Src) 98.5 F (36.9 C) (Oral)  Ht 5\' 7"  (1.702 m)  Wt 193 lb (87.544 kg)  BMI 30.22 kg/m2  SpO2 99% Physical Exam  Constitutional: She is oriented to person, place, and time. She appears well-developed and well-nourished. No distress.  HENT:  Head: Normocephalic and atraumatic.  Right Ear: External ear normal.  Left Ear: External ear normal.  Nose: Nose normal.  Mouth/Throat: Oropharynx is clear and moist. No oropharyngeal  exudate.  Eyes: Conjunctivae are normal. Pupils are equal, round, and reactive to light. Right eye exhibits no discharge. Left eye exhibits no discharge. No scleral icterus.  Neck: Normal range of motion. Neck supple. No tracheal deviation present. No thyromegaly present.  Cardiovascular: Normal rate, regular rhythm, normal heart sounds and intact distal pulses.  Exam reveals no gallop and no friction rub.   No murmur heard. Pulmonary/Chest: Effort normal and breath sounds normal. No accessory muscle usage. No tachypnea. No respiratory distress. She has no decreased breath sounds. She has no wheezes. She has no rhonchi. She has no rales. She exhibits no tenderness.  Musculoskeletal: Normal range of motion. She exhibits no edema or tenderness.  Lymphadenopathy:    She has no cervical adenopathy.  Neurological: She is alert and oriented to person, place, and time. No cranial nerve deficit. She exhibits normal muscle tone. Coordination normal.  Skin: Skin is warm and dry. No rash noted. She is not diaphoretic. No erythema. No pallor.  Psychiatric: Judgment and thought content normal. Her mood appears anxious. Her speech is delayed. She is withdrawn. Cognition and memory are normal. She exhibits a depressed mood. She expresses no suicidal ideation.          Assessment & Plan:  Over 25min of which >50% spent in face-to-face contact with patient discussing plan of care  Problem List Items Addressed This Visit      Unprioritized   Adjustment disorder with mixed anxiety and depressed mood - Primary    Symptoms of persistent anxiety and depression. No improvement with counseling. Will set up evaluation with psychiatry. She denies hallucinations, or suicidal or  homicidal ideation. She will contract for safety. Follow up 4 weeks and prn.    Relevant Medications      ALPRAZolam  Prudy Feeler(XANAX) tablet   Other Relevant Orders      Ambulatory referral to Psychiatry       Return in about 4 weeks (around  12/29/2014) for Recheck.

## 2014-12-01 NOTE — Progress Notes (Signed)
Pre visit review using our clinic review tool, if applicable. No additional management support is needed unless otherwise documented below in the visit note. 

## 2014-12-01 NOTE — Assessment & Plan Note (Signed)
Symptoms of persistent anxiety and depression. No improvement with counseling. Will set up evaluation with psychiatry. She denies hallucinations, or suicidal or homicidal ideation. She will contract for safety. Follow up 4 weeks and prn.

## 2014-12-07 ENCOUNTER — Telehealth: Payer: Self-pay | Admitting: *Deleted

## 2014-12-07 NOTE — Telephone Encounter (Signed)
Dr Maryruth BunKapur, Psychologist, called to let us know that pt showed in her office today for new pt appt.  Pt came in about 10-15 minutes late and was very paranoid about giving information to receptionist (such as insurance, address, etc).  Pt was given paperwork to complete, Pt sat down and began filling out the paperwork, then got up and walked out of the office.  Dr Maryruth BunKapur went outside and was calling the pt's name, pt continued to walk away, slowly, without turning around to acknowledge her name being called.  Dr Maryruth BunKapur feels that the pt is in psychosis and needs to have the police visit her for a wellness check and possibly be seen in the ED to be evaluated by psychiatrist.   Jeanene Erballed and [place] [facility], they will be sending someone out to visit the pt.

## 2015-01-04 ENCOUNTER — Encounter: Payer: Self-pay | Admitting: *Deleted

## 2015-01-04 ENCOUNTER — Encounter: Payer: Self-pay | Admitting: Internal Medicine

## 2015-01-04 ENCOUNTER — Ambulatory Visit (INDEPENDENT_AMBULATORY_CARE_PROVIDER_SITE_OTHER): Payer: BC Managed Care – PPO | Admitting: Internal Medicine

## 2015-01-04 VITALS — BP 95/61 | HR 91 | Temp 98.2°F | Ht 67.0 in | Wt 192.2 lb

## 2015-01-04 DIAGNOSIS — F4323 Adjustment disorder with mixed anxiety and depressed mood: Secondary | ICD-10-CM

## 2015-01-04 DIAGNOSIS — K5900 Constipation, unspecified: Secondary | ICD-10-CM

## 2015-01-04 DIAGNOSIS — Z1239 Encounter for other screening for malignant neoplasm of breast: Secondary | ICD-10-CM

## 2015-01-04 NOTE — Progress Notes (Signed)
Pre visit review using our clinic review tool, if applicable. No additional management support is needed unless otherwise documented below in the visit note. 

## 2015-01-04 NOTE — Assessment & Plan Note (Signed)
Chronic constipation. Encouraged her to start Miralax 17gm po daily -bid. If no improvement at follow up, we discussed additional medication and possible referral for colonoscopy.

## 2015-01-04 NOTE — Patient Instructions (Signed)
We will set up evaluation with psychiatry.

## 2015-01-04 NOTE — Assessment & Plan Note (Signed)
Will set up new referral to psychiatry. Continue Fluoxetine and Alprazolam.

## 2015-01-04 NOTE — Progress Notes (Signed)
Subjective:    Patient ID: Patricia Collins, female    DOB: 18-Jun-1971, 44 y.o.   MRN: [id number]  HPI 43YO female presents for follow up.  Recently referred to psychiatry for ongoing anxiety and depression. Abruptly left psychiatrist's office without being seen. Felt pressured by staff to fill out paperwork.  Felt that staff treated her unfairly.  Has been working with Veterinary surgeon at [employer], Journalist, newspaper. Feels that anxiety generally controlled with medication. Has been taken off payroll at work. Working only 1 day per week.  Continues to have some constipation. Has not been taking Miralax recently. Occasional left upper abdominal pain, however not persistent. No NV. No blood in stool or black stool.  Past medical, surgical, family and social history per today's encounter.  Review of Systems  Constitutional: Negative for fever, chills, appetite change, fatigue and unexpected weight change.  Eyes: Negative for visual disturbance.  Respiratory: Negative for shortness of breath.   Cardiovascular: Negative for chest pain and leg swelling.  Gastrointestinal: Positive for abdominal pain (mild LUQ, intermittent), constipation and abdominal distention. Negative for nausea, vomiting, diarrhea, blood in stool, anal bleeding and rectal pain.  Skin: Negative for color change and rash.  Hematological: Negative for adenopathy. Does not bruise/bleed easily.  Psychiatric/Behavioral: Positive for dysphoric mood. Negative for suicidal ideas and sleep disturbance. The patient is nervous/anxious.        Objective:    BP 95/61 mmHg  Pulse 91  Temp(Src) 98.2 F (36.8 C) (Oral)  Ht  (1.702 m)  Wt 192 lb 4 oz (87.204 kg)  BMI 30.10 kg/m2  SpO2 96% Physical Exam  Constitutional: She is oriented to person, place, and time. She appears well-developed and well-nourished. No distress.  HENT:  Head: Normocephalic and atraumatic.  Right Ear: External ear normal.  Left Ear: External ear normal.    Nose: Nose normal.  Mouth/Throat: Oropharynx is clear and moist. No oropharyngeal exudate.  Eyes: Conjunctivae and EOM are normal. Pupils are equal, round, and reactive to light. Right eye exhibits no discharge.  Neck: Normal range of motion. Neck supple. No thyromegaly present.  Cardiovascular: Normal rate, regular rhythm, normal heart sounds and intact distal pulses.  Exam reveals no gallop and no friction rub.   No murmur heard. Pulmonary/Chest: Effort normal. No respiratory distress. She has no wheezes. She has no rales.  Abdominal: Soft. Bowel sounds are normal. She exhibits no distension and no mass. There is no tenderness. There is no rebound and no guarding.  Musculoskeletal: Normal range of motion. She exhibits no edema or tenderness.  Lymphadenopathy:    She has no cervical adenopathy.  Neurological: She is alert and oriented to person, place, and time. No cranial nerve deficit. Coordination normal.  Skin: Skin is warm and dry. No rash noted. She is not diaphoretic. No erythema. No pallor.  Psychiatric: She has a normal mood and affect. Her behavior is normal. Judgment and thought content normal.          Assessment & Plan:  Over of which >50% spent in face-to-face contact with patient discussing plan of care  Problem List Items Addressed This Visit      Unprioritized   Adjustment disorder with mixed anxiety and depressed mood - Primary    Will set up new referral to psychiatry. Continue Fluoxetine and Alprazolam.      Relevant Orders   Ambulatory referral to Psychiatry   Constipation    Chronic constipation. Encouraged her to start Miralax 17gm po daily -bid.  If no improvement at follow up, we discussed additional medication and possible referral for colonoscopy.      Screening for breast cancer   Relevant Orders   MM Digital Screening       Return in about 3 months (around 04/05/2015) for Physical.

## 2015-01-09 ENCOUNTER — Telehealth: Payer: Self-pay | Admitting: Internal Medicine

## 2015-01-09 NOTE — Telephone Encounter (Signed)
Patient stated that if she does not have anymore medical leave available and if she doesn't go back she can be termanated. Will you be willing to fill out Short term disablity paperwork. Has an appointment will psychiatrist this week.

## 2015-01-09 NOTE — Telephone Encounter (Signed)
Patient called requesting a note to return to work. Patient last seen on 01/04/15 for anxiety and depression. I did not find a note in the chart stating the patient could not work. Please advise

## 2015-01-09 NOTE — Telephone Encounter (Signed)
Needing a note to return to work.

## 2015-01-09 NOTE — Telephone Encounter (Signed)
When I saw her she was already working 1 day per week. Does she want to change this? She needs to be evaluated by psychiatry. This new referral is pending, as she left Dr. Shelda AltesKapur's office without being seen.

## 2015-01-09 NOTE — Telephone Encounter (Signed)
Called patient, no answer Left voicemail requesting a callback to discuss work note.

## 2015-01-09 NOTE — Telephone Encounter (Signed)
She needs to talk with the psychiatrist about whether she needs to take additional time off because of psychiatric issues. They will fill out the paperwork when they make an assessment.

## 2015-01-10 NOTE — Telephone Encounter (Signed)
Called patient no answer. No option to leave voicemail.

## 2015-01-12 ENCOUNTER — Telehealth: Payer: Self-pay | Admitting: *Deleted

## 2015-01-12 NOTE — Telephone Encounter (Signed)
Pt dropped off FMLA forms to be completed.  Per Dr Dan HumphreysWalker, advised pt that she would not complete the forms, any needed forms about short term disability would be from psychiatry.  Pt stated "then fine, I need a doctors note to return to work, I have mortgage and bills to pay" .

## 2015-01-12 NOTE — Telephone Encounter (Signed)
Advised pt, Pt states she will come to the office to get the forms

## 2015-01-12 NOTE — Telephone Encounter (Signed)
This note will need to come from psychiatry. She has severe anxiety and depression and we referred her to psychiatry to help with treatment as well as decision making about appropriate work environment for her. She will need to see psychiatry prior to any letter stating she can return to work.

## 2015-01-24 ENCOUNTER — Encounter: Payer: Self-pay | Admitting: Internal Medicine

## 2015-02-10 ENCOUNTER — Ambulatory Visit: Payer: Self-pay | Admitting: Family Medicine

## 2015-04-05 ENCOUNTER — Encounter: Payer: BC Managed Care – PPO | Admitting: Internal Medicine

## 2015-07-13 ENCOUNTER — Telehealth: Payer: Self-pay | Admitting: Cardiovascular Disease

## 2015-07-13 NOTE — Telephone Encounter (Signed)
Received records from [facility] for appointment on 07/18/15 with Dr Duke Salvia.  Records given to [facility] (medical records) for Dr Leonides Sake schedule on 07/18/15. lp

## 2015-07-18 ENCOUNTER — Encounter: Payer: Self-pay | Admitting: Cardiovascular Disease

## 2015-07-18 NOTE — Progress Notes (Signed)
This encounter was created in error - please disregard.

## 2015-12-20 ENCOUNTER — Emergency Department
Admission: EM | Admit: 2015-12-20 | Discharge: 2015-12-20 | Disposition: A | Discharge status: Home / Self Care | Payer: BC Managed Care – PPO

## 2015-12-21 ENCOUNTER — Emergency Department
Admission: EM | Admit: 2015-12-21 | Discharge: 2015-12-22 | Disposition: A | Discharge status: Home / Self Care | Payer: BC Managed Care – PPO | Attending: Emergency Medicine | Admitting: Emergency Medicine

## 2015-12-21 ENCOUNTER — Emergency Department: Payer: BC Managed Care – PPO

## 2015-12-21 ENCOUNTER — Encounter: Payer: Self-pay | Admitting: Medical Oncology

## 2015-12-21 DIAGNOSIS — Z880 Allergy status to penicillin: Secondary | ICD-10-CM | POA: Diagnosis not present

## 2015-12-21 DIAGNOSIS — R079 Chest pain, unspecified: Secondary | ICD-10-CM

## 2015-12-21 DIAGNOSIS — Z91040 Latex allergy status: Secondary | ICD-10-CM | POA: Insufficient documentation

## 2015-12-21 DIAGNOSIS — K219 Gastro-esophageal reflux disease without esophagitis: Secondary | ICD-10-CM | POA: Insufficient documentation

## 2015-12-21 DIAGNOSIS — Z79899 Other long term (current) drug therapy: Secondary | ICD-10-CM | POA: Diagnosis not present

## 2015-12-21 DIAGNOSIS — K2970 Gastritis, unspecified, without bleeding: Secondary | ICD-10-CM | POA: Insufficient documentation

## 2015-12-21 DIAGNOSIS — Z7951 Long term (current) use of inhaled steroids: Secondary | ICD-10-CM | POA: Diagnosis not present

## 2015-12-21 LAB — URINALYSIS COMPLETE WITH MICROSCOPIC (ARMC ONLY)
BILIRUBIN URINE: NEGATIVE
Bacteria, UA: NONE SEEN
GLUCOSE, UA: NEGATIVE mg/dL
Hgb urine dipstick: NEGATIVE
KETONES UR: NEGATIVE mg/dL
LEUKOCYTES UA: NEGATIVE
Nitrite: NEGATIVE
PH: 6 (ref 5.0–8.0)
Protein, ur: NEGATIVE mg/dL
Specific Gravity, Urine: 1.003 — ABNORMAL LOW (ref 1.005–1.030)

## 2015-12-21 LAB — COMPREHENSIVE METABOLIC PANEL
ALBUMIN: 3.9 g/dL (ref 3.5–5.0)
ALK PHOS: 40 U/L (ref 38–126)
ALT: 11 U/L — AB (ref 14–54)
AST: 17 U/L (ref 15–41)
Anion gap: 4 — ABNORMAL LOW (ref 5–15)
BUN: 14 mg/dL (ref 6–20)
CHLORIDE: 106 mmol/L (ref 101–111)
CO2: 27 mmol/L (ref 22–32)
CREATININE: 0.92 mg/dL (ref 0.44–1.00)
Calcium: 8.7 mg/dL — ABNORMAL LOW (ref 8.9–10.3)
GFR calc Af Amer: >60 mL/min (ref >60)
GLUCOSE: 110 mg/dL — AB (ref 65–99)
Potassium: 4 mmol/L (ref 3.5–5.1)
SODIUM: 137 mmol/L (ref 135–145)
TOTAL PROTEIN: 7.5 g/dL (ref 6.5–8.1)
Total Bilirubin: 0.5 mg/dL (ref 0.3–1.2)

## 2015-12-21 LAB — CBC
HCT: 32.8 % — ABNORMAL LOW (ref 35.0–47.0)
Hemoglobin: 11 g/dL — ABNORMAL LOW (ref 12.0–16.0)
MCH: 30 pg (ref 26.0–34.0)
MCHC: 33.5 g/dL (ref 32.0–36.0)
MCV: 89.7 fL (ref 80.0–100.0)
PLATELETS: 209 10*3/uL (ref 150–440)
RBC: 3.66 MIL/uL — ABNORMAL LOW (ref 3.80–5.20)
RDW: 12.4 % (ref 11.5–14.5)
WBC: 3.1 10*3/uL — AB (ref 3.6–11.0)

## 2015-12-21 LAB — LIPASE, BLOOD: LIPASE: 24 U/L (ref 11–51)

## 2015-12-21 LAB — TROPONIN I: Troponin I: <0.03 ng/mL (ref <0.031)

## 2015-12-21 MED ORDER — GI COCKTAIL ~~LOC~~
30.0000 mL | Freq: Once | ORAL | Status: AC
  Administered 2015-12-21: 30 mL via ORAL
  Filled 2015-12-21: qty 30

## 2015-12-21 NOTE — ED Notes (Signed)
Pt ambulatory to triage with reports that she has been having burning sensation to center of chest since last week and also reports left lower abd pain.

## 2015-12-22 LAB — TROPONIN I: Troponin I: <0.03 ng/mL (ref <0.031)

## 2015-12-22 MED ORDER — PANTOPRAZOLE SODIUM 40 MG PO TBEC
40.0000 mg | DELAYED_RELEASE_TABLET | Freq: Once | ORAL | Status: AC
  Administered 2015-12-22: 40 mg via ORAL
  Filled 2015-12-22: qty 1

## 2015-12-22 MED ORDER — PANTOPRAZOLE SODIUM 40 MG PO TBEC: 40.0000 mg | DELAYED_RELEASE_TABLET | Freq: Every day | ORAL | Status: AC

## 2015-12-22 NOTE — ED Notes (Signed)
Pt dc home ambulatory rates pain 2/10 instructed on follow up plan and med use PT NAD AT DC

## 2015-12-22 NOTE — ED Provider Notes (Signed)
[facility] Emergency Department Provider Note  ____________________________________________  Time seen: Approximately 1110 PM  I have reviewed the triage vital signs and the nursing notes.   HISTORY  Chief Complaint Chest Pain and Abdominal Pain    HPI Patricia Collins is a 45 y.o. female with a history of depression and endometriosis who is presenting today with left-sided abdominal pain as well as burning in her chest over the past week. She says that she does not know why the pain started. She does not remember eating anything strange or that would've made her sick. She denies any vomiting or diarrhea but says she has been nauseous. Denies any shortness of breath. Says that the chest pain feels like a burning is on the left side. She denies any radiation. Says that food has made it worse especially mashed potatoes. She has tried multiple over-the-counter remedies such as Zantac and Tums without any relief. She denies any history of heartburn. Denies any history of hypertension or cardiac disease in her family. She denies smoking or drug use.   denies any worsening symptoms with exertion.    Past Medical History  Diagnosis Date  . Depression   . Endometriosis   . Migraine     improved with diet  . Bronchitis   . Seizures (HCC)     45YO, phenobarbital in the past    Patient Active Problem List   Diagnosis Date Noted  . Constipation 01/04/2015  . Adjustment disorder with mixed anxiety and depressed mood 09/14/2014  . Facial twitching 08/17/2014  . Acid reflux 10/13/2013  . Asthma 10/04/2013  . Anemia 07/19/2013  . Osteopenia 06/21/2013  . Screening for breast cancer 06/21/2013    Past Surgical History  Procedure Laterality Date  . Appendectomy    . Abdominal hysterectomy  2005  . Cesarean section      x1 for fetal distress  . Dilation and curettage of uterus      Current Outpatient Rx  Name  Route  Sig  Dispense  Refill  . ALPRAZolam  (XANAX) 0.25 MG tablet   Oral   Take 1 tablet (0.25 mg total) by mouth 3 (three) times daily as needed for anxiety.   90 tablet   2   . Flunisolide HFA (AEROSPAN) 80 MCG/ACT AERS   Inhalation   Inhale 1 puff into the lungs 2 (two) times daily.         Marland Kitchen. FLUoxetine (PROZAC) 20 MG tablet   Oral   Take 1 tablet (20 mg total) by mouth daily.   30 tablet   3   . Spacer/Aero-Holding Chambers (AEROCHAMBER MV) inhaler      Use as instructed   1 each   0     Allergies Aspirin; Penicillins; Latex; and Strawberry extract  Family History  Problem Relation Age of Onset  . Stroke Mother   . Heart disease Mother     h/o rheumatic fever  . Diabetes Mother   . Hypertension Mother   . Stroke Father   . Arthritis Father   . Hypertension Brother   . Diabetes Brother     Social History Social History  Substance Use Topics  . Smoking status: Never Smoker   . Smokeless tobacco: Never Used  . Alcohol Use: No    Review of Systems Constitutional: No fever/chills Eyes: No visual changes. ENT: No sore throat. Cardiovascular:as above  Respiratory: Denies shortness of breath. Gastrointestinal: no vomiting.  No diarrhea.  No constipation. Genitourinary: Negative  for dysuria. Musculoskeletal: Negative for back pain. Skin: Negative for rash. Neurological: Negative for headaches, focal weakness or numbness.  10-point ROS otherwise negative.  ____________________________________________   PHYSICAL EXAM:  VITAL SIGNS: ED Triage Vitals  Enc Vitals Group     BP 12/21/15 1856 111/66 mmHg     Pulse Rate 12/21/15 1856 74     Resp 12/21/15 1856 18     Temp 12/21/15 1856 98 F (36.7 C)     Temp Source 12/21/15 1856 Oral     SpO2 12/21/15 1856 98 %     Weight 12/21/15 1856 190 lb (86.183 kg)     Height 12/21/15 1856 5\' 7"  (1.702 m)     Head Cir --      Peak Flow --      Pain Score 12/21/15 1857 7     Pain Loc --      Pain Edu? --      Excl. in GC? --     Constitutional:  Alert and oriented. Well appearing and in no acute distress. Eyes: Conjunctivae are normal. PERRL. EOMI. Head: Atraumatic. Nose: No congestion/rhinnorhea. Mouth/Throat: Mucous membranes are moist.  Oropharynx non-erythematous. Neck: No stridor.   Cardiovascular: Normal rate, regular rhythm. Grossly normal heart sounds.  Good peripheral circulation. Respiratory: Normal respiratory effort.  No retractions. Lungs CTAB. Gastrointestiwith tenderness to left upper quadrant as well as left lower quadrant.  No distention.  No CVA tenderness. Musculoskeletal: No lower extremity tenderness nor edema.  No joint effusions. Neurologic:  Normal speech and language. No gross focal neurologic deficits are appreciated. No gait instability. Skin:  Skin is warm, dry and intact. No rash noted. Psychiatric: Mood and affect are normal. Speech and behavior are normal.  ____________________________________________   LABS (all labs ordered are listed, but only abnormal results are displayed)  Labs Reviewed  COMPREHENSIVE METABOLIC PANEL - Abnormal; Notable for the following:    Glucose, Bld 110 (*)    Calcium 8.7 (*)    ALT 11 (*)    Anion gap 4 (*)    All other components within normal limits  CBC - Abnormal; Notable for the following:    WBC 3.1 (*)    RBC 3.66 (*)    Hemoglobin 11.0 (*)    HCT 32.8 (*)    All other components within normal limits  URINALYSIS COMPLETEWITH MICROSCOPIC ([facility] ONLY) - Abnormal; Notable for the following:    Color, Urine STRAW (*)    APPearance CLEAR (*)    Specific Gravity, Urine 1.003 (*)    Squamous Epithelial / LPF 0-5 (*)    All other components within normal limits  LIPASE, BLOOD  TROPONIN I  TROPONIN I   ____________________________________________  EKG  ED ECG REPORT I, Arelia Longest, the attending physician, personally viewed and interpreted this ECG.   Date: 12/22/2015  EKG Time: 1900  Rate: 69  Rhythm: normal sinus rhythm  Axis: Normal  axis  Intervals:none  ST&T Change: T-wave inversion in lead V3. Mild flattening in V4. No ST elevation or depression. No previous EKG for comparison.  ____________________________________________  RADIOL no active cardiopulmonary disease on the chest x-ray. ____________________________________________   PROCEDURES   ____________________________________________   INITIAL IMPRESSION / ASSESSMENT AND PLAN / ED COURSE  Pertinent labs & imaging results that were available during my care of the patient were reviewed by me and considered in my medical decision making (see chart for details).  ----------------------------------------- 12:30 AM on 12/22/2015 -----------------------------------------  After GI cocktail  the patient's pain is gone from an 8 to a 3 out of 10. She is only very mildly tender on palpation to the abdomen at this time. Also with second troponin which is also undetectable. Story very consistent with reflux disease. Unclear cause of the reflux disease but advised the patient follow up with her primary care doctor. She says she has a patient with her primary care doctor, Dr. Thedore Mins, next week. We discussed further testing for things like H. pylori and further workup for reflux. I will prescribe her a proton pump inhibitor for home use. She also knows that she may try Maalox in addition for relief. Very reassuring workup as far as ruling out cardiac etiology.  ____________________________________________   FINAL CLINICAL IMPRESSION(S) / ED DIAGNOSES  Chest pain and abdominal pain. Reflux.     Myrna Blazer, MD 12/22/15 [phone]

## 2016-04-16 ENCOUNTER — Other Ambulatory Visit: Payer: Self-pay | Admitting: Physician Assistant

## 2016-04-16 DIAGNOSIS — R1032 Left lower quadrant pain: Secondary | ICD-10-CM

## 2016-04-16 DIAGNOSIS — K5909 Other constipation: Secondary | ICD-10-CM

## 2016-04-18 ENCOUNTER — Ambulatory Visit
Admission: RE | Admit: 2016-04-18 | Discharge: 2016-04-18 | Disposition: A | Discharge status: Home / Self Care | Payer: BC Managed Care – PPO | Source: Ambulatory Visit | Attending: Physician Assistant | Admitting: Physician Assistant

## 2016-04-18 DIAGNOSIS — N281 Cyst of kidney, acquired: Secondary | ICD-10-CM | POA: Insufficient documentation

## 2016-04-18 DIAGNOSIS — R1032 Left lower quadrant pain: Secondary | ICD-10-CM | POA: Diagnosis present

## 2016-04-18 DIAGNOSIS — K5909 Other constipation: Secondary | ICD-10-CM | POA: Insufficient documentation

## 2016-07-05 ENCOUNTER — Other Ambulatory Visit: Payer: Self-pay | Admitting: Physician Assistant

## 2016-07-05 ENCOUNTER — Ambulatory Visit
Admission: RE | Admit: 2016-07-05 | Discharge: 2016-07-05 | Disposition: A | Discharge status: Home / Self Care | Payer: BC Managed Care – PPO | Source: Ambulatory Visit | Attending: Physician Assistant | Admitting: Physician Assistant

## 2016-07-05 DIAGNOSIS — J329 Chronic sinusitis, unspecified: Secondary | ICD-10-CM

## 2016-07-05 DIAGNOSIS — R51 Headache: Secondary | ICD-10-CM | POA: Diagnosis not present

## 2016-07-05 DIAGNOSIS — R519 Headache, unspecified: Secondary | ICD-10-CM

## 2016-10-11 ENCOUNTER — Ambulatory Visit: Payer: BC Managed Care – PPO | Admitting: Podiatry

## 2016-11-21 ENCOUNTER — Other Ambulatory Visit: Payer: Self-pay | Admitting: Orthopedic Surgery

## 2016-11-21 DIAGNOSIS — M545 Low back pain: Secondary | ICD-10-CM

## 2016-12-10 ENCOUNTER — Encounter: Payer: Self-pay | Admitting: Emergency Medicine

## 2016-12-10 ENCOUNTER — Ambulatory Visit
Admission: EM | Admit: 2016-12-10 | Discharge: 2016-12-10 | Disposition: A | Discharge status: Home / Self Care | Payer: BC Managed Care – PPO | Attending: Internal Medicine | Admitting: Internal Medicine

## 2016-12-10 DIAGNOSIS — S161XXA Strain of muscle, fascia and tendon at neck level, initial encounter: Secondary | ICD-10-CM

## 2016-12-10 DIAGNOSIS — S39012A Strain of muscle, fascia and tendon of lower back, initial encounter: Secondary | ICD-10-CM | POA: Diagnosis not present

## 2016-12-10 MED ORDER — NAPROXEN 500 MG PO TABS: 500.0000 mg | ORAL_TABLET | Freq: Two times a day (BID) | ORAL | 0 refills | Status: AC

## 2016-12-10 MED ORDER — TIZANIDINE HCL 4 MG PO CAPS: 4.0000 mg | ORAL_CAPSULE | Freq: Three times a day (TID) | ORAL | 0 refills | Status: AC
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
## 2016-12-11 ENCOUNTER — Ambulatory Visit
Admission: RE | Admit: 2016-12-11 | Discharge: 2016-12-11 | Disposition: A | Discharge status: Home / Self Care | Payer: BC Managed Care – PPO | Source: Ambulatory Visit | Attending: Orthopedic Surgery | Admitting: Orthopedic Surgery

## 2016-12-11 DIAGNOSIS — G968 Other specified disorders of central nervous system: Secondary | ICD-10-CM | POA: Insufficient documentation

## 2016-12-11 DIAGNOSIS — M545 Low back pain: Secondary | ICD-10-CM | POA: Insufficient documentation

## 2017-01-14 ENCOUNTER — Ambulatory Visit: Payer: BC Managed Care – PPO | Admitting: Physical Therapy

## 2017-01-21 ENCOUNTER — Ambulatory Visit: Payer: BC Managed Care – PPO | Admitting: Physical Therapy

## 2021-02-01 ENCOUNTER — Encounter: Payer: Self-pay | Admitting: Emergency Medicine

## 2021-02-27 ENCOUNTER — Encounter: Payer: Self-pay | Admitting: Emergency Medicine

## 2021-02-27 ENCOUNTER — Emergency Department: Payer: 59

## 2021-02-27 ENCOUNTER — Emergency Department
Admission: EM | Admit: 2021-02-27 | Discharge: 2021-02-28 | Disposition: A | Discharge status: Home / Self Care | Payer: 59 | Attending: Emergency Medicine | Admitting: Emergency Medicine

## 2021-02-27 DIAGNOSIS — Z91040 Latex allergy status: Secondary | ICD-10-CM | POA: Insufficient documentation

## 2021-02-27 DIAGNOSIS — Z86718 Personal history of other venous thrombosis and embolism: Secondary | ICD-10-CM | POA: Insufficient documentation

## 2021-02-27 DIAGNOSIS — Z20822 Contact with and (suspected) exposure to covid-19: Secondary | ICD-10-CM | POA: Insufficient documentation

## 2021-02-27 DIAGNOSIS — R519 Headache, unspecified: Secondary | ICD-10-CM | POA: Diagnosis not present

## 2021-02-27 DIAGNOSIS — J45909 Unspecified asthma, uncomplicated: Secondary | ICD-10-CM | POA: Insufficient documentation

## 2021-02-27 DIAGNOSIS — Z7901 Long term (current) use of anticoagulants: Secondary | ICD-10-CM | POA: Insufficient documentation

## 2021-02-27 DIAGNOSIS — R079 Chest pain, unspecified: Secondary | ICD-10-CM | POA: Diagnosis not present

## 2021-02-27 DIAGNOSIS — Z86711 Personal history of pulmonary embolism: Secondary | ICD-10-CM | POA: Diagnosis not present

## 2021-02-27 LAB — BASIC METABOLIC PANEL
Anion gap: 10 (ref 5–15)
BUN: 15 mg/dL (ref 6–20)
CO2: 24 mmol/L (ref 22–32)
Calcium: 9.1 mg/dL (ref 8.9–10.3)
Chloride: 102 mmol/L (ref 98–111)
Creatinine, Ser: 0.81 mg/dL (ref 0.44–1.00)
GFR, Estimated: >60 mL/min (ref >60)
Glucose, Bld: 152 mg/dL — ABNORMAL HIGH (ref 70–99)
Potassium: 4.2 mmol/L (ref 3.5–5.1)
Sodium: 136 mmol/L (ref 135–145)

## 2021-02-27 LAB — CBC
HCT: 33.6 % — ABNORMAL LOW (ref 36.0–46.0)
Hemoglobin: 10.7 g/dL — ABNORMAL LOW (ref 12.0–15.0)
MCH: 30 pg (ref 26.0–34.0)
MCHC: 31.8 g/dL (ref 30.0–36.0)
MCV: 94.1 fL (ref 80.0–100.0)
Platelets: 204 10*3/uL (ref 150–400)
RBC: 3.57 MIL/uL — ABNORMAL LOW (ref 3.87–5.11)
RDW: 11.9 % (ref 11.5–15.5)
WBC: 4.1 10*3/uL (ref 4.0–10.5)
nRBC: 0 % (ref 0.0–0.2)

## 2021-02-27 LAB — TROPONIN I (HIGH SENSITIVITY): Troponin I (High Sensitivity): <2 ng/L (ref <18)

## 2021-02-27 LAB — D-DIMER, QUANTITATIVE: D-Dimer, Quant: 0.45 ug/mL-FEU (ref 0.00–0.50)

## 2021-02-27 NOTE — ED Triage Notes (Signed)
Pt c/o intermittent left sided chest pain, no SOB but radiates into the left arm. Pt reports symptoms started on Thursday with HA. 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 Denies recent travel or long periods sitting.

## 2021-02-27 NOTE — ED Notes (Signed)
No answer when called from lobby for labs, no answer when cell phone # listed in chart called

## 2021-02-27 NOTE — ED Notes (Signed)
Phone number incorrect in chart...Marland KitchenMarland KitchenMarland Kitchenpt now coming in from lobby for repeat vs and labs

## 2021-02-28 ENCOUNTER — Emergency Department: Payer: 59

## 2021-02-28 LAB — RESP PANEL BY RT-PCR (FLU A&B, COVID) ARPGX2
Influenza A by PCR: NEGATIVE
Influenza B by PCR: NEGATIVE
SARS Coronavirus 2 by RT PCR: NEGATIVE

## 2021-02-28 LAB — TROPONIN I (HIGH SENSITIVITY): Troponin I (High Sensitivity): <2 ng/L (ref <18)

## 2021-02-28 MED ORDER — IOHEXOL 350 MG/ML SOLN
75.0000 mL | Freq: Once | INTRAVENOUS | Status: AC | PRN
  Administered 2021-02-28: 75 mL via INTRAVENOUS

## 2021-02-28 MED ORDER — SODIUM CHLORIDE 0.9 % IV BOLUS
1000.0000 mL | Freq: Once | INTRAVENOUS | Status: AC
  Administered 2021-02-28: 1000 mL via INTRAVENOUS

## 2021-02-28 NOTE — ED Provider Notes (Signed)
[facility] Emergency Department Provider Note   ____________________________________________   Event Date/Time   First MD Initiated Contact with Patient 02/28/21 0020     (approximate)  I have reviewed the triage vital signs and the nursing notes.   HISTORY  Chief Complaint Chest Pain    HPI Patricia Collins is a 50 y.o. female who presents to the ED from home with a chief complaint of chest pain.  Patient reports symptoms started 6 days ago with headache, right leg tenderness, intermittent left-sided chest dull ache almost like a pulled muscle.  Denies diaphoresis or shortness of breath.  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  She was taken off Eliquis secondary to bleeding/hematoma after abdominal surgery.  Denies abdominal pain, nausea/vomiting, dysuria or diarrhea.  Denies recent travel or trauma.  Patient is unvaccinated against COVID-19.     Past Medical History:  Diagnosis Date  . Bronchitis   . Depression   . Endometriosis   . Migraine    improved with diet  . Seizures (HCC)    50YO, phenobarbital in the past    Patient Active Problem List   Diagnosis Date Noted  . Constipation 01/04/2015  . Adjustment disorder with mixed anxiety and depressed mood 09/14/2014  . Facial twitching 08/17/2014  . Acid reflux 10/13/2013  . Asthma 10/04/2013  . Anemia 07/19/2013  . Osteopenia 06/21/2013  . Screening for breast cancer 06/21/2013    Past Surgical History:  Procedure Laterality Date  . ABDOMINAL HYSTERECTOMY  2005  . APPENDECTOMY    . CESAREAN SECTION     x1 for fetal distress  . DILATION AND CURETTAGE OF UTERUS      Prior to Admission medications   Medication Sig Start Date End Date Taking? Authorizing Provider  ALPRAZolam (XANAX) 0.25 MG tablet Take 1 tablet (0.25 mg total) by mouth 3 (three) times daily as needed for anxiety. 12/01/14   Shelia Media, MD  Flunisolide HFA  (AEROSPAN) 80 MCG/ACT AERS Inhale 1 puff into the lungs 2 (two) times daily.    [provider]  FLUoxetine (PROZAC) 20 MG tablet Take 1 tablet (20 mg total) by mouth daily. 09/15/14   Shelia Media, MD  naproxen (NAPROSYN) 500 MG tablet Take 1 tablet (500 mg total) by mouth 2 (two) times daily with a meal. 12/10/16   Lutricia Feil, PA-C  pantoprazole (PROTONIX) 40 MG tablet Take 1 tablet (40 mg total) by mouth daily. 12/22/15 12/21/16  Schaevitz, Myra Rude, MD  Spacer/Aero-Holding Chambers (AEROCHAMBER MV) inhaler Use as instructed 12/03/13   Lupita Leash, MD  tiZANidine (ZANAFLEX) 4 MG capsule Take 1 capsule (4 mg total) by mouth 3 (three) times daily. 12/10/16   Lutricia Feil, PA-C    Allergies Penicillins, Latex, and Strawberry extract  Family History  Problem Relation Age of Onset  . Stroke Mother   . Heart disease Mother        h/o rheumatic fever  . Diabetes Mother   . Hypertension Mother   . Stroke Father   . Arthritis Father   . Alzheimer's disease Father   . Hypertension Brother   . Diabetes Brother     Social History Social History   Tobacco Use  . Smoking status: Never Smoker  . Smokeless tobacco: Never Used  Substance Use Topics  . Alcohol use: No    Alcohol/week: 0.0 standard drinks  . Drug use: No  Review of Systems  Constitutional: No fever/chills Eyes: No visual changes. ENT: No sore throat. Cardiovascular: Positive for chest pain. Respiratory: Denies shortness of breath. Gastrointestinal: No abdominal pain.  No nausea, no vomiting.  No diarrhea.  No constipation. Genitourinary: Negative for dysuria. Musculoskeletal: Negative for back pain. Skin: Negative for rash. Neurological: Negative for headaches, focal weakness or numbness.   ____________________________________________   PHYSICAL EXAM:  VITAL SIGNS: ED Triage Vitals [02/27/21 2020]  Enc Vitals Group     BP 122/76     Pulse Rate 64     Resp 18     Temp  98.9 F (37.2 C)     Temp Source Oral     SpO2 100 %     Weight 176 lb (79.8 kg)     Height      Head Circumference      Peak Flow      Pain Score      Pain Loc      Pain Edu?      Excl. in GC?     Constitutional: Alert and oriented. Well appearing and in no acute distress. Eyes: Conjunctivae are normal. PERRL. EOMI. Head: Atraumatic. Nose: No congestion/rhinnorhea. Mouth/Throat: Mucous membranes are moist.   Neck: No stridor.   Cardiovascular: Normal rate, regular rhythm. Grossly normal heart sounds.  Good peripheral circulation. Respiratory: Normal respiratory effort.  No retractions. Lungs CTAB. Gastrointestinal: Soft and nontender. No distention. No abdominal bruits. No CVA tenderness. Musculoskeletal: No lower extremity tenderness nor edema.  No calf tenderness either calf.  No joint effusions. Neurologic:  Normal speech and language. No gross focal neurologic deficits are appreciated. No gait instability. Skin:  Skin is warm, dry and intact. No rash noted. Psychiatric: Mood and affect are normal. Speech and behavior are normal.  ____________________________________________   LABS (all labs ordered are listed, but only abnormal results are displayed)  Labs Reviewed  BASIC METABOLIC PANEL - Abnormal; Notable for the following components:      Result Value   Glucose, Bld 152 (*)    All other components within normal limits  CBC - Abnormal; Notable for the following components:   RBC 3.57 (*)    Hemoglobin 10.7 (*)    HCT 33.6 (*)    All other components within normal limits  RESP PANEL BY RT-PCR (FLU A&B, COVID) ARPGX2  D-DIMER, QUANTITATIVE  TROPONIN I (HIGH SENSITIVITY)  TROPONIN I (HIGH SENSITIVITY)   ____________________________________________  EKG  ED ECG REPORT I, Sheila Ocasio J, the attending physician, personally viewed and interpreted this ECG.   Date: 02/28/2021  EKG Time: 2015  Rate: 67  Rhythm: normal EKG, normal sinus rhythm  Axis: Normal   Intervals:none  ST&T Change: Nonspecific  ____________________________________________  RADIOLOGY I, Hiram Mciver J, personally viewed and evaluated these images (plain radiographs) as part of my medical decision making, as well as reviewing the written report by the radiologist.  ED MD interpretation: No acute cardiopulmonary process; questionable left-sided nodule  Official radiology report(s): DG Chest 2 View  Result Date: 02/27/2021 CLINICAL DATA:  Left-sided chest pain. EXAM: CHEST - 2 VIEW COMPARISON:  12/21/2015 FINDINGS: The lungs are clear without focal pneumonia, edema, pneumothorax or pleural effusion. Subtle nodular density identified over the anterior left sixth rib end. The cardiopericardial silhouette is within normal limits for size. Thoracolumbar scoliosis again noted. IMPRESSION: Subtle nodular density projects over the anterior left sixth rib. CT chest without contrast recommended to further evaluate. Otherwise no acute findings. Electronically Signed   By: Minerva Areola  Molli Posey M.D.   On: 02/27/2021 20:37    ____________________________________________   PROCEDURES  Procedure(s) performed (including Critical Care):  .1-3 Lead EKG Interpretation Performed by: Irean Hong, MD Authorized by: Irean Hong, MD     Interpretation: normal     ECG rate:  67   ECG rate assessment: normal     Rhythm: sinus rhythm     Ectopy: none     Conduction: normal   Comments:     Patient placed on cardiac monitor to evaluate for arrhythmias     ____________________________________________   INITIAL IMPRESSION / ASSESSMENT AND PLAN / ED COURSE  As part of my medical decision making, I reviewed the following data within the electronic MEDICAL RECORD NUMBER Nursing notes reviewed and incorporated, Labs reviewed, EKG interpreted, Old chart reviewed, Radiograph reviewed and Notes from prior ED visits     50 year old female presenting with chest pain. Differential diagnosis includes, but is  not limited to, ACS, aortic dissection, pulmonary embolism, cardiac tamponade, pneumothorax, pneumonia, pericarditis, myocarditis, GI-related causes including esophagitis/gastritis, and musculoskeletal chest wall pain.    2 sets of negative troponins and normal EKG.  Despite negative D-dimer will obtain CTA chest not only to evaluate possible nodule seen on chest x-ray but given patient's history of DVT/PE to definitively rule out PE.  Clinical Course as of 02/28/21 0502  Wed Feb 28, 2021  0240 Patient resting in no acute distress.  Updated her of unremarkable CTA and [place] results.  Strict return precautions given.  Patient verbalizes understanding and agrees with plan of care. [JS]    Clinical Course User Index [JS] Irean Hong, MD     ____________________________________________   FINAL CLINICAL IMPRESSION(S) / ED DIAGNOSES  Final diagnoses:  Nonspecific chest pain     ED Discharge Orders    None      *Please note:  Jessiah Wojnar was evaluated in Emergency Department on 02/28/2021 for the symptoms described in the history of present illness. She was evaluated in the context of the global COVID-19 pandemic, which necessitated consideration that the patient might be at risk for infection with the SARS-CoV-2 virus that causes COVID-19. Institutional protocols and algorithms that pertain to the evaluation of patients at risk for COVID-19 are in a state of rapid change based on information released by regulatory bodies including the CDC and federal and state organizations. These policies and algorithms were followed during the patient's care in the ED.  Some ED evaluations and interventions may be delayed as a result of limited staffing during and the pandemic.*   Note:  This document was prepared using Dragon voice recognition software and may include unintentional dictation errors.   Irean Hong, MD 02/28/21 [id number]

## 2021-02-28 NOTE — ED Notes (Signed)
Pt to imaging at this time.

## 2021-02-28 NOTE — Discharge Instructions (Signed)
You may take Ibuprofen as needed and apply moist heat to affected area.  Return to the ER for worsening symptoms, persistent vomiting, difficulty breathing or other concerns

## 2021-02-28 NOTE — ED Provider Notes (Incomplete)
[facility] Emergency Department Provider Note   ____________________________________________   Event Date/Time   First MD Initiated Contact with Patient 02/28/21 0020     (approximate)  I have reviewed the triage vital signs and the nursing notes.   HISTORY  Chief Complaint Chest Pain    HPI Patricia Collins is a 50 y.o. female who presents to the ED from home with a chief complaint of chest pain.  Patient reports symptoms started 6 days ago with headache, right leg tenderness, intermittent left-sided chest dull ache almost like a pulled muscle.  Denies diaphoresis or shortness of breath.  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  She was taken off Eliquis secondary to bleeding/hematoma after abdominal surgery.  Denies abdominal pain, nausea/vomiting, dysuria or diarrhea.  Denies recent travel or trauma.  Patient is unvaccinated against COVID-19.     Past Medical History:  Diagnosis Date  . Bronchitis   . Depression   . Endometriosis   . Migraine    improved with diet  . Seizures (HCC)    50YO, phenobarbital in the past    Patient Active Problem List   Diagnosis Date Noted  . Constipation 01/04/2015  . Adjustment disorder with mixed anxiety and depressed mood 09/14/2014  . Facial twitching 08/17/2014  . Acid reflux 10/13/2013  . Asthma 10/04/2013  . Anemia 07/19/2013  . Osteopenia 06/21/2013  . Screening for breast cancer 06/21/2013    Past Surgical History:  Procedure Laterality Date  . ABDOMINAL HYSTERECTOMY  2005  . APPENDECTOMY    . CESAREAN SECTION     x1 for fetal distress  . DILATION AND CURETTAGE OF UTERUS      Prior to Admission medications   Medication Sig Start Date End Date Taking? Authorizing Provider  ALPRAZolam (XANAX) 0.25 MG tablet Take 1 tablet (0.25 mg total) by mouth 3 (three) times daily as needed for anxiety. 12/01/14   Shelia Media, MD  Flunisolide HFA  (AEROSPAN) 80 MCG/ACT AERS Inhale 1 puff into the lungs 2 (two) times daily.    [provider]  FLUoxetine (PROZAC) 20 MG tablet Take 1 tablet (20 mg total) by mouth daily. 09/15/14   Shelia Media, MD  naproxen (NAPROSYN) 500 MG tablet Take 1 tablet (500 mg total) by mouth 2 (two) times daily with a meal. 12/10/16   Lutricia Feil, PA-C  pantoprazole (PROTONIX) 40 MG tablet Take 1 tablet (40 mg total) by mouth daily. 12/22/15 12/21/16  Schaevitz, Myra Rude, MD  Spacer/Aero-Holding Chambers (AEROCHAMBER MV) inhaler Use as instructed 12/03/13   Lupita Leash, MD  tiZANidine (ZANAFLEX) 4 MG capsule Take 1 capsule (4 mg total) by mouth 3 (three) times daily. 12/10/16   Lutricia Feil, PA-C    Allergies Penicillins, Latex, and Strawberry extract  Family History  Problem Relation Age of Onset  . Stroke Mother   . Heart disease Mother        h/o rheumatic fever  . Diabetes Mother   . Hypertension Mother   . Stroke Father   . Arthritis Father   . Alzheimer's disease Father   . Hypertension Brother   . Diabetes Brother     Social History Social History   Tobacco Use  . Smoking status: Never Smoker  . Smokeless tobacco: Never Used  Substance Use Topics  . Alcohol use: No    Alcohol/week: 0.0 standard drinks  . Drug use: No  Review of Systems  Constitutional: No fever/chills Eyes: No visual changes. ENT: No sore throat. Cardiovascular: Positive for chest pain. Respiratory: Denies shortness of breath. Gastrointestinal: No abdominal pain.  No nausea, no vomiting.  No diarrhea.  No constipation. Genitourinary: Negative for dysuria. Musculoskeletal: Negative for back pain. Skin: Negative for rash. Neurological: Negative for headaches, focal weakness or numbness.   ____________________________________________   PHYSICAL EXAM:  VITAL SIGNS: ED Triage Vitals [02/27/21 2020]  Enc Vitals Group     BP 122/76     Pulse Rate 64     Resp 18     Temp  98.9 F (37.2 C)     Temp Source Oral     SpO2 100 %     Weight 176 lb (79.8 kg)     Height      Head Circumference      Peak Flow      Pain Score      Pain Loc      Pain Edu?      Excl. in GC?     Constitutional: Alert and oriented. Well appearing and in no acute distress. Eyes: Conjunctivae are normal. PERRL. EOMI. Head: Atraumatic. Nose: No congestion/rhinnorhea. Mouth/Throat: Mucous membranes are moist.   Neck: No stridor.   Cardiovascular: Normal rate, regular rhythm. Grossly normal heart sounds.  Good peripheral circulation. Respiratory: Normal respiratory effort.  No retractions. Lungs CTAB. Gastrointestinal: Soft and nontender. No distention. No abdominal bruits. No CVA tenderness. Musculoskeletal: No lower extremity tenderness nor edema.  No calf tenderness either calf.  No joint effusions. Neurologic:  Normal speech and language. No gross focal neurologic deficits are appreciated. No gait instability. Skin:  Skin is warm, dry and intact. No rash noted. Psychiatric: Mood and affect are normal. Speech and behavior are normal.  ____________________________________________   LABS (all labs ordered are listed, but only abnormal results are displayed)  Labs Reviewed  BASIC METABOLIC PANEL - Abnormal; Notable for the following components:      Result Value   Glucose, Bld 152 (*)    All other components within normal limits  CBC - Abnormal; Notable for the following components:   RBC 3.57 (*)    Hemoglobin 10.7 (*)    HCT 33.6 (*)    All other components within normal limits  RESP PANEL BY RT-PCR (FLU A&B, COVID) ARPGX2  D-DIMER, QUANTITATIVE  TROPONIN I (HIGH SENSITIVITY)  TROPONIN I (HIGH SENSITIVITY)   ____________________________________________  EKG  ED ECG REPORT I, SUNG,JADE J, the attending physician, personally viewed and interpreted this ECG.   Date: 02/28/2021  EKG Time: 2015  Rate: 67  Rhythm: normal EKG, normal sinus rhythm  Axis: Normal   Intervals:none  ST&T Change: Nonspecific  ____________________________________________  RADIOLOGY I, SUNG,JADE J, personally viewed and evaluated these images (plain radiographs) as part of my medical decision making, as well as reviewing the written report by the radiologist.  ED MD interpretation: No acute cardiopulmonary process; questionable left-sided nodule; no PE or lung nodule on CTA chest; negative BLE [place]  Official radiology report(s): DG Chest 2 View  Result Date: 02/27/2021 CLINICAL DATA:  Left-sided chest pain. EXAM: CHEST - 2 VIEW COMPARISON:  12/21/2015 FINDINGS: The lungs are clear without focal pneumonia, edema, pneumothorax or pleural effusion. Subtle nodular density identified over the anterior left sixth rib end. The cardiopericardial silhouette is within normal limits for size. Thoracolumbar scoliosis again noted. IMPRESSION: Subtle nodular density projects over the anterior left sixth rib. CT chest without contrast recommended to further  evaluate. Otherwise no acute findings. Electronically Signed   By: Kennith Center M.D.   On: 02/27/2021 20:37   CT Angio Chest PE W/Cm &/Or Wo Cm  Result Date: 02/28/2021 CLINICAL DATA:  Left-sided chest pain radiating to left arm, headache, history of thromboembolic disease EXAM: CT ANGIOGRAPHY CHEST WITH CONTRAST TECHNIQUE: Multidetector CT imaging of the chest was performed using the standard protocol during bolus administration of intravenous contrast. Multiplanar CT image reconstructions and MIPs were obtained to evaluate the vascular anatomy. CONTRAST:  70mL OMNIPAQUE IOHEXOL 350 MG/ML SOLN COMPARISON:  02/27/2021 FINDINGS: Cardiovascular: This is a technically adequate evaluation of the pulmonary vasculature. No filling defects or pulmonary emboli. The heart is unremarkable without pericardial effusion. Normal caliber of the thoracic aorta. No evidence of dissection. Mediastinum/Nodes: Calcified subcarinal lymph nodes consistent with  previous granulomatous disease. Thyroid, trachea, and esophagus are unremarkable. Lungs/Pleura: No acute airspace disease, effusion, or pneumothorax. Central airways are patent. Upper Abdomen: No acute abnormality. Musculoskeletal: No acute or destructive bony lesions. Reconstructed images demonstrate no additional findings. Review of the MIP images confirms the above findings. IMPRESSION: 1. No evidence of pulmonary embolus. 2. No acute intrathoracic process. 3. The nodularity seen at the left lung base on chest x-ray likely reflects superimposition of shadows, as there is no CT abnormality in that region. Electronically Signed   By: Sharlet Salina M.D.   On: 02/28/2021 01:45   US Venous Img Lower Bilateral (DVT)  Result Date: 02/28/2021 CLINICAL DATA:  Bilateral ankle edema EXAM: BILATERAL LOWER EXTREMITY VENOUS DOPPLER ULTRASOUND TECHNIQUE: Gray-scale sonography with compression, as well as color and duplex ultrasound, were performed to evaluate the deep venous system(s) from the level of the common femoral vein through the popliteal and proximal calf veins. COMPARISON:  None. FINDINGS: VENOUS Normal compressibility of the common femoral, superficial femoral, and popliteal veins, as well as the visualized calf veins. Visualized portions of profunda femoral vein and great saphenous vein unremarkable. No filling defects to suggest DVT on grayscale or color Doppler imaging. Doppler waveforms show normal direction of venous flow, normal respiratory plasticity and response to augmentation. OTHER None. Limitations: none IMPRESSION: 1. No evidence of deep venous thrombosis within either lower extremity. Electronically Signed   By: Sharlet Salina M.D.   On: 02/28/2021 02:19    ____________________________________________   PROCEDURES  Procedure(s) performed (including Critical Care):  .1-3 Lead EKG Interpretation Performed by: Irean Hong, MD Authorized by: Irean Hong, MD     Interpretation: normal      ECG rate:  67   ECG rate assessment: normal     Rhythm: sinus rhythm     Ectopy: none     Conduction: normal   Comments:     Patient placed on cardiac monitor to evaluate for arrhythmias     ____________________________________________   INITIAL IMPRESSION / ASSESSMENT AND PLAN / ED COURSE  As part of my medical decision making, I reviewed the following data within the electronic MEDICAL RECORD NUMBER Nursing notes reviewed and incorporated, Labs reviewed, EKG interpreted, Old chart reviewed, Radiograph reviewed and Notes from prior ED visits     50 year old female presenting with chest pain. Differential diagnosis includes, but is not limited to, ACS, aortic dissection, pulmonary embolism, cardiac tamponade, pneumothorax, pneumonia, pericarditis, myocarditis, GI-related causes including esophagitis/gastritis, and musculoskeletal chest wall pain.    2 sets of negative troponins and normal EKG.  Despite negative D-dimer will obtain CTA chest not only to evaluate possible nodule seen on chest x-ray but  given patient's history of DVT/PE to definitively rule out PE.  Clinical Course as of 02/28/21 0242  Wed Feb 28, 2021  0240 Patient resting in no acute distress.  Updated her of unremarkable CTA and [place] results.  Strict return precautions given.  Patient verbalizes understanding and agrees with plan of care. [JS]    Clinical Course User Index [JS] Irean Hong, MD     ____________________________________________   FINAL CLINICAL IMPRESSION(S) / ED DIAGNOSES  Final diagnoses:  Nonspecific chest pain     ED Discharge Orders    None      *Please note:  Patricia Collins was evaluated in Emergency Department on 02/28/2021 for the symptoms described in the history of present illness. She was evaluated in the context of the global COVID-19 pandemic, which necessitated consideration that the patient might be at risk for infection with the SARS-CoV-2 virus that causes COVID-19.  Institutional protocols and algorithms that pertain to the evaluation of patients at risk for COVID-19 are in a state of rapid change based on information released by regulatory bodies including the CDC and federal and state organizations. These policies and algorithms were followed during the patient's care in the ED.  Some ED evaluations and interventions may be delayed as a result of limited staffing during and the pandemic.*   Note:  This document was prepared using Dragon voice recognition software and may include unintentional dictation errors.

## 2021-02-28 NOTE — ED Notes (Signed)
Dr. Sung at bedside.  

## 2021-06-07 ENCOUNTER — Other Ambulatory Visit: Payer: Self-pay

## 2021-06-07 ENCOUNTER — Ambulatory Visit
Admission: RE | Admit: 2021-06-07 | Discharge: 2021-06-07 | Disposition: A | Discharge status: Home / Self Care | Payer: 59 | Source: Ambulatory Visit | Attending: Emergency Medicine | Admitting: Emergency Medicine

## 2021-06-07 VITALS — BP 120/77 | HR 71 | Temp 98.7°F | Resp 18

## 2021-06-07 DIAGNOSIS — J069 Acute upper respiratory infection, unspecified: Secondary | ICD-10-CM | POA: Diagnosis not present

## 2021-06-07 HISTORY — DX: Other pulmonary embolism without acute cor pulmonale: I26.99

## 2021-06-07 NOTE — Discharge Instructions (Addendum)
Your COVID test is pending.  You should self quarantine until the test result is back.    Continue to use your albuterol inhaler as needed.  Go to the emergency department if you have acute shortness of breath or other concerning symptoms.  Take Tylenol or ibuprofen as needed for fever or discomfort.  Rest and keep yourself hydrated.    Establish a primary care provider.  Assistance from [facility] health has been requested.

## 2021-06-07 NOTE — ED Triage Notes (Signed)
Pt presents today with c/o of nasal congestion, sinus pressure, bilateral ear pain and body aches x 3 days. She took home Covid test last evening which was negative.

## 2021-06-07 NOTE — ED Provider Notes (Signed)
Renaldo Fiddler    CSN: [id number] Arrival date & time: 06/07/21  1134      History   Chief Complaint Chief Complaint  Patient presents with   Nasal Congestion   Otalgia   Appt 12:00    HPI Patricia Collins is a 50 y.o. female.  Patient presents with 2-day history of ear pain, sore throat, sinus congestion, cough.  She felt warm last night but did not take her temperature.  She also reports wheezing and mild shortness of breath.  Treatment at home with her albuterol inhaler.  She denies rash, vomiting, diarrhea, or other symptoms.  Her medical history includes pulmonary embolism, bronchitis, Asthma, seizures, migraine headaches, depression.  The history is provided by the patient and medical records.   Past Medical History:  Diagnosis Date   Bronchitis    Depression    Endometriosis    Migraine    improved with diet   Pulmonary embolism (HCC)    Seizures (HCC)    50YO, phenobarbital in the past    Patient Active Problem List   Diagnosis Date Noted   Constipation 01/04/2015   Adjustment disorder with mixed anxiety and depressed mood 09/14/2014   Facial twitching 08/17/2014   Acid reflux 10/13/2013   Asthma 10/04/2013   Anemia 07/19/2013   Osteopenia 06/21/2013   Screening for breast cancer 06/21/2013    Past Surgical History:  Procedure Laterality Date   ABDOMINAL HYSTERECTOMY  2005   APPENDECTOMY     CESAREAN SECTION     x1 for fetal distress   DILATION AND CURETTAGE OF UTERUS      OB History   No obstetric history on file.      Home Medications    Prior to Admission medications   Medication Sig Start Date End Date Taking? Authorizing Provider  ALPRAZolam (XANAX) 0.25 MG tablet Take 1 tablet (0.25 mg total) by mouth 3 (three) times daily as needed for anxiety. 12/01/14   Shelia Media, MD  Flunisolide HFA 80 MCG/ACT AERS Inhale 1 puff into the lungs 2 (two) times daily.    [provider]  FLUoxetine (PROZAC) 20 MG tablet Take  1 tablet (20 mg total) by mouth daily. 09/15/14   Shelia Media, MD  naproxen (NAPROSYN) 500 MG tablet Take 1 tablet (500 mg total) by mouth 2 (two) times daily with a meal. 12/10/16   Lutricia Feil, PA-C  pantoprazole (PROTONIX) 40 MG tablet Take 1 tablet (40 mg total) by mouth daily. 12/22/15 12/21/16  Schaevitz, Myra Rude, MD  Spacer/Aero-Holding Chambers (AEROCHAMBER MV) inhaler Use as instructed 12/03/13   Lupita Leash, MD  tiZANidine (ZANAFLEX) 4 MG capsule Take 1 capsule (4 mg total) by mouth 3 (three) times daily. 12/10/16   Lutricia Feil, PA-C    Family History Family History  Problem Relation Age of Onset   Stroke Mother    Heart disease Mother        h/o rheumatic fever   Diabetes Mother    Hypertension Mother    Stroke Father    Arthritis Father    Alzheimer's disease Father    Hypertension Brother    Diabetes Brother     Social History Social History   Tobacco Use   Smoking status: Never   Smokeless tobacco: Never  Substance Use Topics   Alcohol use: No    Alcohol/week: 0.0 standard drinks   Drug use: No     Allergies   Penicillins, Latex, and Strawberry  extract   Review of Systems Review of Systems  Constitutional:  Negative for chills and fever.  HENT:  Positive for congestion, ear pain and sore throat.   Respiratory:  Positive for cough, shortness of breath and wheezing.   Cardiovascular:  Negative for chest pain and palpitations.  Gastrointestinal:  Negative for abdominal pain, diarrhea and vomiting.  Skin:  Negative for color change and rash.  All other systems reviewed and are negative.   Physical Exam Triage Vital Signs ED Triage Vitals  Enc Vitals Group     BP      Pulse      Resp      Temp      Temp src      SpO2      Weight      Height      Head Circumference      Peak Flow      Pain Score      Pain Loc      Pain Edu?      Excl. in GC?    No data found.  Updated Vital Signs BP 120/77 (BP Location: Left  Arm)   Pulse 71   Temp 98.7 F (37.1 C) (Oral)   Resp 18   SpO2 97%   Visual Acuity Right Eye Distance:   Left Eye Distance:   Bilateral Distance:    Right Eye Near:   Left Eye Near:    Bilateral Near:     Physical Exam Vitals and nursing note reviewed.  Constitutional:      General: She is not in acute distress.    Appearance: She is well-developed. She is not ill-appearing.  HENT:     Head: Normocephalic and atraumatic.     Right Ear: Tympanic membrane normal.     Left Ear: Tympanic membrane normal.     Nose: Nose normal.     Mouth/Throat:     Mouth: Mucous membranes are moist.     Pharynx: Oropharynx is clear.  Eyes:     Conjunctiva/sclera: Conjunctivae normal.  Cardiovascular:     Rate and Rhythm: Normal rate and regular rhythm.     Heart sounds: Normal heart sounds.  Pulmonary:     Effort: Pulmonary effort is normal. No respiratory distress.     Breath sounds: Normal breath sounds. No wheezing, rhonchi or rales.  Abdominal:     Palpations: Abdomen is soft.     Tenderness: There is no abdominal tenderness.  Musculoskeletal:     Cervical back: Neck supple.  Skin:    General: Skin is warm and dry.  Neurological:     General: No focal deficit present.     Mental Status: She is alert and oriented to person, place, and time.     Gait: Gait normal.  Psychiatric:        Mood and Affect: Mood normal.        Behavior: Behavior normal.     UC Treatments / Results  Labs (all labs ordered are listed, but only abnormal results are displayed) Labs Reviewed  NOVEL CORONAVIRUS, NAA    EKG   Radiology No results found.  Procedures Procedures (including critical care time)  Medications Ordered in UC Medications - No data to display  Initial Impression / Assessment and Plan / UC Course  I have reviewed the triage vital signs and the nursing notes.  Pertinent labs & imaging results that were available during my care of the patient were reviewed by me and  considered in my medical decision making (see chart for details).   Viral URI with cough.  Patient is well-appearing and her exam is reassuring.  No respiratory distress, lungs are clear, O2 sat 97% on room air.  Work note provided per patient request.  COVID pending.  Instructed patient to self quarantine per CDC guidelines.  Discussed symptomatic treatment including albuterol inhaler, Tylenol or ibuprofen, rest, hydration.  Instructed patient to follow up with PCP if symptoms are not improving.  Patient agrees to plan of care.    Final Clinical Impressions(s) / UC Diagnoses   Final diagnoses:  Viral URI with cough     Discharge Instructions      Your COVID test is pending.  You should self quarantine until the test result is back.    Continue to use your albuterol inhaler as needed.  Go to the emergency department if you have acute shortness of breath or other concerning symptoms.  Take Tylenol or ibuprofen as needed for fever or discomfort.  Rest and keep yourself hydrated.    Establish a primary care provider.  Assistance from [facility] has been requested.           ED Prescriptions   None    PDMP not reviewed this encounter.   Mickie Bail, NP 06/07/21 1245

## 2021-06-08 LAB — SARS-COV-2, NAA 2 DAY TAT

## 2021-06-08 LAB — NOVEL CORONAVIRUS, NAA: SARS-CoV-2, NAA: NOT DETECTED

## 2021-06-12 ENCOUNTER — Encounter (HOSPITAL_COMMUNITY): Payer: Self-pay

## 2021-08-06 ENCOUNTER — Other Ambulatory Visit: Payer: Self-pay

## 2021-08-06 ENCOUNTER — Emergency Department
Admission: EM | Admit: 2021-08-06 | Discharge: 2021-08-06 | Disposition: A | Discharge status: Home / Self Care | Payer: 59 | Attending: Emergency Medicine | Admitting: Emergency Medicine

## 2021-08-06 ENCOUNTER — Encounter: Payer: Self-pay | Admitting: Emergency Medicine

## 2021-08-06 DIAGNOSIS — U071 COVID-19: Secondary | ICD-10-CM | POA: Diagnosis not present

## 2021-08-06 DIAGNOSIS — Z5321 Procedure and treatment not carried out due to patient leaving prior to being seen by health care provider: Secondary | ICD-10-CM | POA: Insufficient documentation

## 2021-08-06 DIAGNOSIS — R519 Headache, unspecified: Secondary | ICD-10-CM | POA: Diagnosis present

## 2021-08-06 NOTE — ED Notes (Signed)
Called pt name x3 to be roomed. No answer. 

## 2021-08-06 NOTE — ED Triage Notes (Signed)
Pt via POV from home. Pt is COVID positive and found out this AM. Pt c/o headache, nasal congestion, CP from coughing per pt, cough. Denies fever. Pt is A&Ox4 and NAD.

## 2021-08-06 NOTE — ED Notes (Addendum)
Called pt name x3 to be roomed. No answer. 

## 2021-08-15 ENCOUNTER — Other Ambulatory Visit: Payer: Self-pay

## 2021-08-15 ENCOUNTER — Ambulatory Visit
Admission: RE | Admit: 2021-08-15 | Discharge: 2021-08-15 | Disposition: A | Discharge status: Home / Self Care | Payer: 59 | Source: Ambulatory Visit | Attending: Family Medicine | Admitting: Family Medicine

## 2021-08-15 ENCOUNTER — Other Ambulatory Visit: Payer: Self-pay | Admitting: Family Medicine

## 2021-08-15 DIAGNOSIS — M79605 Pain in left leg: Secondary | ICD-10-CM

## 2021-08-15 DIAGNOSIS — M79604 Pain in right leg: Secondary | ICD-10-CM

## 2021-10-01 ENCOUNTER — Ambulatory Visit: Payer: Self-pay | Admitting: Podiatry

## 2021-10-03 ENCOUNTER — Ambulatory Visit: Payer: Self-pay | Admitting: Podiatry

## 2021-10-16 ENCOUNTER — Other Ambulatory Visit: Payer: Self-pay | Admitting: Gastroenterology

## 2021-10-16 DIAGNOSIS — R1084 Generalized abdominal pain: Secondary | ICD-10-CM

## 2021-10-16 DIAGNOSIS — R198 Other specified symptoms and signs involving the digestive system and abdomen: Secondary | ICD-10-CM

## 2021-11-09 ENCOUNTER — Other Ambulatory Visit: Payer: Self-pay

## 2021-11-09 ENCOUNTER — Ambulatory Visit
Admission: RE | Admit: 2021-11-09 | Discharge: 2021-11-09 | Disposition: A | Discharge status: Home / Self Care | Payer: 59 | Source: Ambulatory Visit | Attending: Gastroenterology | Admitting: Gastroenterology

## 2021-11-09 ENCOUNTER — Other Ambulatory Visit: Payer: Self-pay | Admitting: Gastroenterology

## 2021-11-09 DIAGNOSIS — R1084 Generalized abdominal pain: Secondary | ICD-10-CM

## 2021-11-09 DIAGNOSIS — R198 Other specified symptoms and signs involving the digestive system and abdomen: Secondary | ICD-10-CM | POA: Diagnosis present

## 2021-11-09 HISTORY — DX: Unspecified asthma, uncomplicated: J45.909

## 2021-11-12 ENCOUNTER — Encounter: Payer: Self-pay | Admitting: Podiatry

## 2021-11-12 ENCOUNTER — Other Ambulatory Visit: Payer: Self-pay

## 2021-11-12 ENCOUNTER — Ambulatory Visit (INDEPENDENT_AMBULATORY_CARE_PROVIDER_SITE_OTHER): Payer: 59 | Admitting: Podiatry

## 2021-11-12 ENCOUNTER — Ambulatory Visit (INDEPENDENT_AMBULATORY_CARE_PROVIDER_SITE_OTHER): Payer: 59

## 2021-11-12 VITALS — BP 111/66 | HR 67 | Temp 98.2°F | Resp 16

## 2021-11-12 DIAGNOSIS — S93622A Sprain of tarsometatarsal ligament of left foot, initial encounter: Secondary | ICD-10-CM | POA: Diagnosis not present

## 2021-11-12 DIAGNOSIS — M722 Plantar fascial fibromatosis: Secondary | ICD-10-CM

## 2021-11-12 MED ORDER — NABUMETONE 500 MG PO TABS: 500.0000 mg | ORAL_TABLET | Freq: Two times a day (BID) | ORAL | 0 refills | Status: AC | PRN

## 2021-11-12 NOTE — Progress Notes (Signed)
  Subjective:  Patient ID: Patricia Collins, female    DOB: [birth year],  MRN: [id number]  Chief Complaint  Patient presents with   Foot Pain    "My left foot hurts on the top of my foot and on the heel.  My right one hurts on the heel and on the bottom."    50 y.o. female presents with the above complaint. History confirmed with patient.  She fell about 6 months ago and was treated nonoperatively with a boot by [facility].  X-rays at that time showed no fracture or dislocation.  Was getting better until she had a second fall a few months later and pain has continued to become worse.  Most the pain is in the midfoot with weightbearing and movement.  Boot is beneficial.  She also has heel pain on both feet feels like she may be overcompensating and putting more pressure on the bottom of her feet.  Objective:  Physical Exam: warm, good capillary refill, no trophic changes or ulcerative lesions, normal DP and PT pulses, and normal sensory exam. Left Foot: point tenderness over the heel pad and sharp pain in the Lisfranc interval with manipulation of the midfoot and range of motion of the second and third rays Right Foot: point tenderness over the heel pad  No images are attached to the encounter.  Radiographs: Multiple views x-ray of both feet: no fracture, dislocation, swelling or degenerative changes noted, pes planus, and possible widening of the Lisfranc interval left, no heel spurs plantar both Assessment:   1. Lisfranc's sprain, left, initial encounter   2. Plantar fasciitis, bilateral      Plan:  Patient was evaluated and treated and all questions answered.  Discussed the etiology and treatment options for plantar fasciitis including stretching, formal physical therapy, supportive shoegears such as a running shoe or sneaker, pre fabricated orthoses, injection therapy, and oral medications. We also discussed the role of surgical treatment of this for patients who do not improve  after exhausting non-surgical treatment options.   -XR reviewed with patient -Educated patient on stretching and icing of the affected limb -Rx for meloxicam. Educated on use, risks and benefits of the medication -Suspect most of the heel pain is compensatory and hopefully will resolve once we address her forefoot pain   For left forefoot pain this is her chief issue and is very sharp and limiting.  I suspect she may have a midtarsal or Lisfranc ligamentous injury.  X-rays were unrevealing.  I recommend an MRI to evaluate the ligament.  WBAT in regular shoe gear that is supportive or the boot if this is more comfortable.  Meloxicam should help with pain as well.  Advised to ice when painful.  I will see her back after the MRI   Return for after MRI to review.

## 2021-11-27 ENCOUNTER — Encounter: Payer: Self-pay | Admitting: General Surgery

## 2021-11-28 ENCOUNTER — Encounter: Payer: Self-pay | Admitting: Registered Nurse

## 2021-11-28 ENCOUNTER — Ambulatory Visit: Admission: RE | Admit: 2021-11-28 | Payer: 59 | Source: Ambulatory Visit | Admitting: General Surgery

## 2021-11-28 ENCOUNTER — Encounter: Admission: RE | Payer: Self-pay | Source: Ambulatory Visit

## 2021-11-28 HISTORY — DX: Anemia, unspecified: D64.9

## 2021-11-28 HISTORY — DX: Palpitations: R00.2

## 2021-11-28 SURGERY — COLONOSCOPY
Anesthesia: General

## 2021-12-19 ENCOUNTER — Other Ambulatory Visit: Payer: 59
